# Patient Record
Sex: Male | Born: 1960 | Race: White | Hispanic: No | Marital: Single | Smoking: Never smoker
Health system: Southern US, Community
[De-identification: ages and names within clinical notes are randomized; demographics above are authoritative.]

## PROBLEM LIST (undated history)

## (undated) DIAGNOSIS — I4891 Unspecified atrial fibrillation: Secondary | ICD-10-CM

## (undated) DIAGNOSIS — M1990 Unspecified osteoarthritis, unspecified site: Secondary | ICD-10-CM

## (undated) DIAGNOSIS — I872 Venous insufficiency (chronic) (peripheral): Secondary | ICD-10-CM

## (undated) DIAGNOSIS — N184 Chronic kidney disease, stage 4 (severe): Secondary | ICD-10-CM

## (undated) DIAGNOSIS — N400 Enlarged prostate without lower urinary tract symptoms: Secondary | ICD-10-CM

## (undated) DIAGNOSIS — G4733 Obstructive sleep apnea (adult) (pediatric): Secondary | ICD-10-CM

## (undated) DIAGNOSIS — D510 Vitamin B12 deficiency anemia due to intrinsic factor deficiency: Secondary | ICD-10-CM

## (undated) HISTORY — DX: Vitamin B12 deficiency anemia due to intrinsic factor deficiency: D51.0

## (undated) HISTORY — DX: Unspecified atrial fibrillation: I48.91

## (undated) HISTORY — DX: Unspecified osteoarthritis, unspecified site: M19.90

## (undated) HISTORY — DX: Chronic kidney disease, stage 4 (severe): N18.4

## (undated) HISTORY — DX: Obstructive sleep apnea (adult) (pediatric): G47.33

---

## 2006-04-29 ENCOUNTER — Encounter: Admission: RE | Admit: 2006-04-29 | Discharge: 2006-04-29 | Payer: Self-pay | Admitting: Family Medicine

## 2009-01-05 ENCOUNTER — Ambulatory Visit: Payer: Self-pay | Admitting: Urology

## 2014-04-10 ENCOUNTER — Ambulatory Visit: Payer: Self-pay | Admitting: Specialist

## 2019-06-05 ENCOUNTER — Other Ambulatory Visit: Payer: Self-pay

## 2019-06-05 ENCOUNTER — Encounter: Payer: Self-pay | Admitting: Emergency Medicine

## 2019-06-05 ENCOUNTER — Inpatient Hospital Stay
Admission: EM | Admit: 2019-06-05 | Discharge: 2019-06-10 | DRG: 682 | Disposition: A | Discharge status: Home / Self Care | Payer: BC Managed Care – PPO | Attending: Internal Medicine | Admitting: Internal Medicine

## 2019-06-05 ENCOUNTER — Emergency Department: Payer: BC Managed Care – PPO

## 2019-06-05 DIAGNOSIS — N289 Disorder of kidney and ureter, unspecified: Secondary | ICD-10-CM

## 2019-06-05 DIAGNOSIS — E860 Dehydration: Secondary | ICD-10-CM | POA: Diagnosis present

## 2019-06-05 DIAGNOSIS — Z8249 Family history of ischemic heart disease and other diseases of the circulatory system: Secondary | ICD-10-CM

## 2019-06-05 DIAGNOSIS — I872 Venous insufficiency (chronic) (peripheral): Secondary | ICD-10-CM | POA: Diagnosis present

## 2019-06-05 DIAGNOSIS — N390 Urinary tract infection, site not specified: Secondary | ICD-10-CM | POA: Diagnosis present

## 2019-06-05 DIAGNOSIS — J189 Pneumonia, unspecified organism: Secondary | ICD-10-CM | POA: Diagnosis present

## 2019-06-05 DIAGNOSIS — N400 Enlarged prostate without lower urinary tract symptoms: Secondary | ICD-10-CM | POA: Diagnosis present

## 2019-06-05 DIAGNOSIS — D696 Thrombocytopenia, unspecified: Secondary | ICD-10-CM | POA: Diagnosis present

## 2019-06-05 DIAGNOSIS — R0600 Dyspnea, unspecified: Secondary | ICD-10-CM

## 2019-06-05 DIAGNOSIS — R509 Fever, unspecified: Secondary | ICD-10-CM

## 2019-06-05 DIAGNOSIS — Z20828 Contact with and (suspected) exposure to other viral communicable diseases: Secondary | ICD-10-CM | POA: Diagnosis present

## 2019-06-05 DIAGNOSIS — N179 Acute kidney failure, unspecified: Secondary | ICD-10-CM | POA: Diagnosis not present

## 2019-06-05 DIAGNOSIS — I4891 Unspecified atrial fibrillation: Secondary | ICD-10-CM | POA: Diagnosis present

## 2019-06-05 DIAGNOSIS — G4730 Sleep apnea, unspecified: Secondary | ICD-10-CM | POA: Diagnosis present

## 2019-06-05 DIAGNOSIS — Z6841 Body Mass Index (BMI) 40.0 and over, adult: Secondary | ICD-10-CM

## 2019-06-05 HISTORY — DX: Benign prostatic hyperplasia without lower urinary tract symptoms: N40.0

## 2019-06-05 HISTORY — DX: Venous insufficiency (chronic) (peripheral): I87.2

## 2019-06-05 LAB — URINALYSIS, ROUTINE W REFLEX MICROSCOPIC
Bilirubin Urine: NEGATIVE
Glucose, UA: NEGATIVE mg/dL
Ketones, ur: NEGATIVE mg/dL
Nitrite: NEGATIVE
Protein, ur: 100 mg/dL — AB
Specific Gravity, Urine: 1.016 (ref 1.005–1.030)
Squamous Epithelial / HPF: NONE SEEN (ref 0–5)
WBC, UA: >50 WBC/hpf — ABNORMAL HIGH (ref 0–5)
pH: 5 (ref 5.0–8.0)

## 2019-06-05 LAB — LACTIC ACID, PLASMA: Lactic Acid, Venous: 1.8 mmol/L (ref 0.5–1.9)

## 2019-06-05 LAB — CBC WITH DIFFERENTIAL/PLATELET
Abs Immature Granulocytes: 0.29 10*3/uL — ABNORMAL HIGH (ref 0.00–0.07)
Basophils Absolute: 0 10*3/uL (ref 0.0–0.1)
Basophils Relative: 0 %
Eosinophils Absolute: 0 10*3/uL (ref 0.0–0.5)
Eosinophils Relative: 0 %
HCT: 44.8 % (ref 39.0–52.0)
Hemoglobin: 15.5 g/dL (ref 13.0–17.0)
Immature Granulocytes: 2 %
Lymphocytes Relative: 4 %
Lymphs Abs: 0.5 10*3/uL — ABNORMAL LOW (ref 0.7–4.0)
MCH: 31.4 pg (ref 26.0–34.0)
MCHC: 34.6 g/dL (ref 30.0–36.0)
MCV: 90.7 fL (ref 80.0–100.0)
Monocytes Absolute: 0.7 10*3/uL (ref 0.1–1.0)
Monocytes Relative: 6 %
Neutro Abs: 10.6 10*3/uL — ABNORMAL HIGH (ref 1.7–7.7)
Neutrophils Relative %: 88 %
Platelets: 116 10*3/uL — ABNORMAL LOW (ref 150–400)
RBC: 4.94 MIL/uL (ref 4.22–5.81)
RDW: 12.9 % (ref 11.5–15.5)
WBC: 12.1 10*3/uL — ABNORMAL HIGH (ref 4.0–10.5)
nRBC: 0 % (ref 0.0–0.2)

## 2019-06-05 LAB — BASIC METABOLIC PANEL
Anion gap: 10 (ref 5–15)
BUN: 32 mg/dL — ABNORMAL HIGH (ref 6–20)
CO2: 19 mmol/L — ABNORMAL LOW (ref 22–32)
Calcium: 8.8 mg/dL — ABNORMAL LOW (ref 8.9–10.3)
Chloride: 103 mmol/L (ref 98–111)
Creatinine, Ser: 2.45 mg/dL — ABNORMAL HIGH (ref 0.61–1.24)
GFR calc Af Amer: 33 mL/min — ABNORMAL LOW (ref >60)
GFR calc non Af Amer: 28 mL/min — ABNORMAL LOW (ref >60)
Glucose, Bld: 125 mg/dL — ABNORMAL HIGH (ref 70–99)
Potassium: 3.8 mmol/L (ref 3.5–5.1)
Sodium: 132 mmol/L — ABNORMAL LOW (ref 135–145)

## 2019-06-05 LAB — HEPATIC FUNCTION PANEL
ALT: 19 U/L (ref 0–44)
AST: 27 U/L (ref 15–41)
Albumin: 3.1 g/dL — ABNORMAL LOW (ref 3.5–5.0)
Alkaline Phosphatase: 79 U/L (ref 38–126)
Bilirubin, Direct: 0.8 mg/dL — ABNORMAL HIGH (ref 0.0–0.2)
Indirect Bilirubin: 1.5 mg/dL — ABNORMAL HIGH (ref 0.3–0.9)
Total Bilirubin: 2.3 mg/dL — ABNORMAL HIGH (ref 0.3–1.2)
Total Protein: 6.9 g/dL (ref 6.5–8.1)

## 2019-06-05 LAB — TROPONIN I (HIGH SENSITIVITY)
Troponin I (High Sensitivity): 10 ng/L (ref <18)
Troponin I (High Sensitivity): 14 ng/L (ref <18)

## 2019-06-05 MED ORDER — SODIUM CHLORIDE 0.9 % IV SOLN
500.0000 mg | INTRAVENOUS | Status: DC
  Administered 2019-06-06 – 2019-06-10 (×5): 500 mg via INTRAVENOUS
  Filled 2019-06-05 (×9): qty 500

## 2019-06-05 MED ORDER — SODIUM CHLORIDE 0.9 % IV BOLUS
1000.0000 mL | Freq: Once | INTRAVENOUS | Status: AC
  Administered 2019-06-06: 1000 mL via INTRAVENOUS

## 2019-06-05 MED ORDER — HEPARIN SODIUM (PORCINE) 5000 UNIT/ML IJ SOLN
5000.0000 [IU] | Freq: Three times a day (TID) | INTRAMUSCULAR | Status: DC
  Administered 2019-06-06: 5000 [IU] via SUBCUTANEOUS
  Filled 2019-06-05 (×2): qty 1

## 2019-06-05 MED ORDER — ACETAMINOPHEN 325 MG PO TABS
650.0000 mg | ORAL_TABLET | Freq: Four times a day (QID) | ORAL | Status: DC | PRN
  Filled 2019-06-05: qty 2

## 2019-06-05 MED ORDER — SODIUM CHLORIDE 0.9 % IV SOLN
INTRAVENOUS | Status: AC
  Administered 2019-06-06: 05:00:00 via INTRAVENOUS

## 2019-06-05 MED ORDER — SODIUM CHLORIDE 0.9 % IV BOLUS
500.0000 mL | Freq: Once | INTRAVENOUS | Status: AC
  Administered 2019-06-05: 500 mL via INTRAVENOUS

## 2019-06-05 MED ORDER — SODIUM CHLORIDE 0.9 % IV SOLN
2.0000 g | INTRAVENOUS | Status: DC
  Administered 2019-06-05 – 2019-06-09 (×5): 2 g via INTRAVENOUS
  Filled 2019-06-05 (×2): qty 2
  Filled 2019-06-05: qty 20
  Filled 2019-06-05 (×2): qty 2
  Filled 2019-06-05: qty 20

## 2019-06-05 NOTE — ED Provider Notes (Signed)
[facility] Emergency Department Provider Note ____________________________________________   First MD Initiated Contact with Patient 06/05/19 2003     (approximate)  I have reviewed the triage vital signs and the nursing notes.   HISTORY  Chief Complaint Fever    HPI Otelia SanteeDarrell W Dias is a 58 y.o. male who presents with fever over the last several days, intermittent course, measured to 101-102, and temporarily relieved by Tylenol.  The patient also reports shortness of breath for about 1 to 2 weeks.  He denies cough.  He has no vomiting or diarrhea.  He reports some generalized weakness, and he reports foul-smelling urine for several weeks, but denies other focal symptoms.  He has no known exposures to COVID-19.  History reviewed. No pertinent past medical history.  There are no active problems to display for this patient.   History reviewed. No pertinent surgical history.  Prior to Admission medications   Not on File    Allergies Patient has no known allergies.  No family history on file.  Social History Social History   Tobacco Use  . Smoking status: Never Smoker  . Smokeless tobacco: Never Used  Substance Use Topics  . Alcohol use: Never    Frequency: Never  . Drug use: Never    Review of Systems  Constitutional: Positive for fever. Eyes: No redness ENT: No sore throat. Cardiovascular: Denies chest pain. Respiratory: Positive for shortness of breath. Gastrointestinal: No vomiting or diarrhea.  Genitourinary: Negative for dysuria.  Musculoskeletal: Negative for back pain. Skin: Negative for rash. Neurological: Negative for headache.   ____________________________________________   PHYSICAL EXAM:  VITAL SIGNS: ED Triage Vitals  Enc Vitals Group     BP 06/05/19 1428 128/85     Pulse Rate 06/05/19 1428 (!) 117     Resp 06/05/19 1428 (!) 26     Temp 06/05/19 1428 99.6 F (37.6 C)     Temp Source 06/05/19 1428 Oral    SpO2 06/05/19 1428 95 %     Weight 06/05/19 1429 (!) 427 lb 11.1 oz (194 kg)     Height 06/05/19 1429 6' (1.829 m)     Head Circumference --      Peak Flow --      Pain Score 06/05/19 1429 0     Pain Loc --      Pain Edu? --      Excl. in GC? --     Constitutional: Alert and oriented.  Relatively well appearing and in no acute distress. Eyes: Conjunctivae are normal.  Head: Atraumatic. Nose: No congestion/rhinnorhea. Mouth/Throat: Mucous membranes are slightly dry.   Neck: Normal range of motion.  Cardiovascular: Tachycardic, regular rhythm. Grossly normal heart sounds.  Good peripheral circulation. Respiratory: Slightly increased respiratory effort.  No retractions. Lungs CTAB. Gastrointestinal: Soft and nontender. No distention.  Genitourinary: No flank tenderness. Musculoskeletal: Chronic appearing venous stasis changes bilaterally.  No erythema, induration, or abnormal warmth. Neurologic:  Normal speech and language. No gross focal neurologic deficits are appreciated.  Skin:  Skin is warm and dry. No rash noted. Psychiatric: Mood and affect are normal. Speech and behavior are normal.  ____________________________________________   LABS (all labs ordered are listed, but only abnormal results are displayed)  Labs Reviewed  CBC WITH DIFFERENTIAL/PLATELET - Abnormal; Notable for the following components:      Result Value   WBC 12.1 (*)    Platelets 116 (*)    Neutro Abs 10.6 (*)    Lymphs Abs 0.5 (*)  Abs Immature Granulocytes 0.29 (*)    All other components within normal limits  BASIC METABOLIC PANEL - Abnormal; Notable for the following components:   Sodium 132 (*)    CO2 19 (*)    Glucose, Bld 125 (*)    BUN 32 (*)    Creatinine, Ser 2.45 (*)    Calcium 8.8 (*)    GFR calc non Af Amer 28 (*)    GFR calc Af Amer 33 (*)    All other components within normal limits  HEPATIC FUNCTION PANEL - Abnormal; Notable for the following components:   Albumin 3.1 (*)     Total Bilirubin 2.3 (*)    Bilirubin, Direct 0.8 (*)    Indirect Bilirubin 1.5 (*)    All other components within normal limits  SARS CORONAVIRUS 2 (TAT 6-24 HRS)  LACTIC ACID, PLASMA  URINALYSIS, COMPLETE (UACMP) WITH MICROSCOPIC  TROPONIN I (HIGH SENSITIVITY)  TROPONIN I (HIGH SENSITIVITY)  TROPONIN I (HIGH SENSITIVITY)   ____________________________________________  EKG  ED ECG REPORT I, Arta Silence, the attending physician, personally viewed and interpreted this ECG.  Date: 06/05/2019 EKG Time: 1441 Rate: 119 Rhythm: Sinus tachycardia QRS Axis: Left axis Intervals: normal ST/T Wave abnormalities: Nonspecific T wave abnormalities Narrative Interpretation: Nonspecific abnormalities with no evidence of acute ischemia  ____________________________________________  RADIOLOGY  CXR: Right lower lobe infiltrate versus atelectasis  ____________________________________________   PROCEDURES  Procedure(s) performed: No  Procedures  Critical Care performed: No ____________________________________________   INITIAL IMPRESSION / ASSESSMENT AND PLAN / ED COURSE  Pertinent labs & imaging results that were available during my care of the patient were reviewed by me and considered in my medical decision making (see chart for details).  58 year old male with no significant PMH presents with fever over the last several days associated with shortness of breath over the last 1 to 2 weeks.  He also reports foul-smelling urine although this is a more chronic symptom.  He denies other acute symptoms.  I reviewed the past medical records in [place], but the patient has no prior ED visits or admissions here and I do not see any relevant outpatient records other than medication refills.  On exam, the patient is overall relatively comfortable appearing.  He is tachycardic and has slightly increased work of breathing but no acute respiratory distress.  His other  vital signs are normal.  The remainder of the exam is as described above.  Initial work-up reveals right lower lobe atelectasis versus infiltrate.  Given the fever and shortness of breath, I suspect that this represents pneumonia.  Differential also includes urinary tract infection, COVID-19, or other viral etiology.  Lab work-up also reveals apparent AKI although I do not have an old creatinine to compare to.  The patient has no known history of renal failure.  Given this finding, I will plan to admit the patient.  ----------------------------------------- 9:03 PM on 06/05/2019 -----------------------------------------  I discussed the patient with the hospitalist for admission.  IV antibiotics have been ordered to cover for CAP.  The lactate is normal, but I have ordered fluids given the AKI and likely dehydration.  ____________________________________________   FINAL CLINICAL IMPRESSION(S) / ED DIAGNOSES  Final diagnoses:  Community acquired pneumonia of right lower lobe of lung  Acute renal insufficiency      NEW MEDICATIONS STARTED DURING THIS VISIT:  New Prescriptions   No medications on file     Note:  This document was prepared using Dragon voice recognition software  and may include unintentional dictation errors.    Dionne Bucy, MD 06/05/19 2104

## 2019-06-05 NOTE — ED Notes (Signed)
Attempted x 2 to stick patient for lactic.  Unsuccessful.  Phlebotomy called to draw patient.

## 2019-06-05 NOTE — ED Triage Notes (Signed)
Pt in via POV, sent over from [facility], complaints of fever x 3 days.  Pt tachypneic and short of breath on exertion.  NAD noted at this time.

## 2019-06-05 NOTE — H&P (Signed)
History and Physical    Louis [name] JAS:[id number] DOB: 11/08/1960 DOA: 06/05/2019  PCP: Patient, No Pcp Per  Patient coming from: Home  I have personally briefly reviewed patient's old medical records in [facility]  Chief Complaint: Fevers  HPI: Louis [name] is a 58 y.o. 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  Patient states he has been having 4 days of fevers and diaphoresis associated with nausea without emesis.  He denies any shortness of breath or cough.  He denies any chest pain, abdominal pain, diarrhea.  He reports decreased urine output than expected.  He does say he has a history of enlarged prostate for which he took finasteride in the past however has not been taking it for some time.    He has been taking Tylenol every 4 hours without relief.  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  He denies any history of tobacco use, alcohol use, or illicit drug use.  ED Course:  Initial vitals showed BP 128/85, pulse 117, RR 26, temp 99.6 Fahrenheit, SPO2 95% on room air.  Labs are notable for WBC 12.1, hemoglobin 15.5, platelets 116,000, sodium 132, potassium 3.8, bicarb 19, BUN 32, creatinine 2.45, serum glucose 125, troponin I 10 >> 14, lactic acid 1.8.  SARS-CoV-2 test was obtained and pending.  2 view chest x-ray showed minimal bibasilar densities, suspected right lung base atelectasis.  Patient was given 500 mLs normal saline and started on IV ceftriaxone and azithromycin.  Hospitalist service was consulted to admit for further evaluation and management  Review of Systems: All systems reviewed and are negative except as documented in history of present illness above.   Past Medical History:  Diagnosis Date  . BPH (benign prostatic hyperplasia)   . Chronic venous stasis dermatitis     History reviewed. No pertinent surgical  history.  Social History:  reports that he has never smoked. He has never used smokeless tobacco. He reports that he does not drink alcohol or use drugs.  No Known Allergies  Family History  Problem Relation Age of Onset  . Heart disease Father      Prior to Admission medications   Not on File    Physical Exam: Vitals:   06/05/19 1428 06/05/19 1429  BP: 128/85   Pulse: (!) 117   Resp: (!) 26   Temp: 99.6 F (37.6 C)   TempSrc: Oral   SpO2: 95%   Weight:  (!) 194 kg  Height:  6' (1.829 m)    Constitutional: Obese man resting in bed with head elevated, NAD, calm, comfortable Eyes: PERRL, lids and conjunctivae normal ENMT: Mucous membranes are moist. Posterior pharynx clear of any exudate or lesions.Normal dentition.  Neck: normal, supple, no masses. Respiratory: clear to auscultation bilaterally, no wheezing, no crackles. Normal respiratory effort. No accessory muscle use.  Cardiovascular: Tachycardic, no murmurs / rubs / gallops. No extremity edema. 2+ pedal pulses. Abdomen: no tenderness, no masses palpated. No hepatosplenomegaly. Bowel sounds positive.  Musculoskeletal: no clubbing / cyanosis. No joint deformity upper and lower extremities. Good ROM, no contractures. Normal muscle tone.  Skin: Diaphoretic.  Stasis dermatitis changes of both legs.  No induration Neurologic: CN 2-12 grossly intact. Sensation intact, DTR normal. Strength 5/5 in all 4.  Psychiatric: Normal judgment and insight. Alert and oriented x 3. Normal mood.    Labs on Admission: I have  personally reviewed following labs and imaging studies  CBC: Recent Labs  Lab 06/05/19 1433  WBC 12.1*  NEUTROABS 10.6*  HGB 15.5  HCT 44.8  MCV 90.7  PLT 034*   Basic Metabolic Panel: Recent Labs  Lab 06/05/19 1433  NA 132*  K 3.8  CL 103  CO2 19*  GLUCOSE 125*  BUN 32*  CREATININE 2.45*  CALCIUM 8.8*   GFR: Estimated Creatinine Clearance: 58.4 mL/min (A) (by C-G formula based on SCr of 2.45  mg/dL (H)). Liver Function Tests: Recent Labs  Lab 06/05/19 1433  AST 27  ALT 19  ALKPHOS 79  BILITOT 2.3*  PROT 6.9  ALBUMIN 3.1*   No results for input(s): LIPASE, AMYLASE in the last 168 hours. No results for input(s): AMMONIA in the last 168 hours. Coagulation Profile: No results for input(s): INR, PROTIME in the last 168 hours. Cardiac Enzymes: No results for input(s): CKTOTAL, CKMB, CKMBINDEX, TROPONINI in the last 168 hours. BNP (last 3 results) No results for input(s): PROBNP in the last 8760 hours. HbA1C: No results for input(s): HGBA1C in the last 72 hours. CBG: No results for input(s): GLUCAP in the last 168 hours. Lipid Profile: No results for input(s): CHOL, HDL, LDLCALC, TRIG, CHOLHDL, LDLDIRECT in the last 72 hours. Thyroid Function Tests: No results for input(s): TSH, T4TOTAL, FREET4, T3FREE, THYROIDAB in the last 72 hours. Anemia Panel: No results for input(s): VITAMINB12, FOLATE, FERRITIN, TIBC, IRON, RETICCTPCT in the last 72 hours. Urine analysis: No results found for: COLORURINE, APPEARANCEUR, LABSPEC, PHURINE, GLUCOSEU, HGBUR, BILIRUBINUR, KETONESUR, PROTEINUR, UROBILINOGEN, NITRITE, LEUKOCYTESUR  Radiological Exams on Admission: Dg Chest 2 View  Result Date: 06/05/2019 CLINICAL DATA:  58 year old male with shortness of breath and fever. EXAM: CHEST - 2 VIEW COMPARISON:  Chest radiograph dated 04/10/2014. FINDINGS: Mild eventration of the right hemidiaphragm similar to prior radiograph. Minimal bibasilar densities, likely atelectatic changes. No focal consolidation, pleural effusion, or pneumothorax. The cardiac silhouette is within normal limits. No acute osseous pathology. IMPRESSION: Right lung base atelectasis.  Infiltrate is less likely. Electronically Signed   By: Anner Crete M.D.   On: 06/05/2019 15:01    EKG: Independently reviewed. Sinus tachycardia, PVC, LAD, no prior for comparison.  Assessment/Plan Principal Problem:   Febrile illness  Active Problems:   AKI (acute kidney injury) ([place])   Thrombocytopenia (HCC)  Louis [name] is a 58 y.o. 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  Febrile illness: Patient with reported fevers at home with diaphoresis and flulike symptoms.  Leukocytosis present on admission.  Chest x-ray without obvious focal consolidation.  He was started on empiric IV ceftriaxone/azithromycin in ED for possible CAP. SARS-CoV-2 test is pending. -Continue IV ceftriaxone/azithromycin for now, de-escalate as able -Check procalcitonin, obtain blood cultures, check urinalysis -Follow-up SARS-CoV-2 test, add on influenza test -Continue as needed Tylenol  Acute kidney injury: Creatinine 2.45 on admission, no baseline for comparison.  Suspect AKI in setting of dehydration due to above. -Give additional 1 L normal saline followed by maintenance IVFs overnight -Check renal ultrasound, urinalysis  Thrombocytopenia: Mild, without obvious bleeding.  No indication for transfusion at this time.  Continue to monitor.  DVT prophylaxis: Subcutaneous heparin Code Status: Full code, confirmed with patient Family Communication: Discussed with patient, he has discussed with family Disposition Plan: Pending clinical progress Consults called: None Admission status: Observation   Zada Finders MD [facility]  If 7PM-7AM,  please contact night-coverage [email or web address]  06/05/2019, 11:32 PM

## 2019-06-05 NOTE — ED Triage Notes (Signed)
First Nurse Note:  Arrives from [facility] for ED evaluation of 4 day history of fever, sob.

## 2019-06-06 ENCOUNTER — Observation Stay
Admit: 2019-06-06 | Discharge: 2019-06-06 | Disposition: A | Discharge status: Home / Self Care | Payer: BC Managed Care – PPO | Attending: Nurse Practitioner | Admitting: Nurse Practitioner

## 2019-06-06 ENCOUNTER — Other Ambulatory Visit: Payer: Self-pay

## 2019-06-06 ENCOUNTER — Observation Stay: Payer: BC Managed Care – PPO

## 2019-06-06 DIAGNOSIS — N289 Disorder of kidney and ureter, unspecified: Secondary | ICD-10-CM | POA: Diagnosis present

## 2019-06-06 DIAGNOSIS — N390 Urinary tract infection, site not specified: Secondary | ICD-10-CM | POA: Diagnosis present

## 2019-06-06 DIAGNOSIS — J189 Pneumonia, unspecified organism: Secondary | ICD-10-CM | POA: Diagnosis present

## 2019-06-06 DIAGNOSIS — N179 Acute kidney failure, unspecified: Secondary | ICD-10-CM | POA: Diagnosis present

## 2019-06-06 DIAGNOSIS — R509 Fever, unspecified: Secondary | ICD-10-CM | POA: Diagnosis not present

## 2019-06-06 DIAGNOSIS — E860 Dehydration: Secondary | ICD-10-CM | POA: Diagnosis present

## 2019-06-06 DIAGNOSIS — N400 Enlarged prostate without lower urinary tract symptoms: Secondary | ICD-10-CM | POA: Diagnosis present

## 2019-06-06 DIAGNOSIS — Z8249 Family history of ischemic heart disease and other diseases of the circulatory system: Secondary | ICD-10-CM | POA: Diagnosis not present

## 2019-06-06 DIAGNOSIS — D696 Thrombocytopenia, unspecified: Secondary | ICD-10-CM | POA: Diagnosis present

## 2019-06-06 DIAGNOSIS — G4730 Sleep apnea, unspecified: Secondary | ICD-10-CM | POA: Diagnosis present

## 2019-06-06 DIAGNOSIS — I4891 Unspecified atrial fibrillation: Secondary | ICD-10-CM | POA: Diagnosis not present

## 2019-06-06 DIAGNOSIS — N3000 Acute cystitis without hematuria: Secondary | ICD-10-CM | POA: Diagnosis not present

## 2019-06-06 DIAGNOSIS — Z20828 Contact with and (suspected) exposure to other viral communicable diseases: Secondary | ICD-10-CM | POA: Diagnosis present

## 2019-06-06 DIAGNOSIS — I872 Venous insufficiency (chronic) (peripheral): Secondary | ICD-10-CM | POA: Diagnosis present

## 2019-06-06 DIAGNOSIS — Z6841 Body Mass Index (BMI) 40.0 and over, adult: Secondary | ICD-10-CM | POA: Diagnosis not present

## 2019-06-06 LAB — BASIC METABOLIC PANEL
Anion gap: 11 (ref 5–15)
BUN: 37 mg/dL — ABNORMAL HIGH (ref 6–20)
CO2: 18 mmol/L — ABNORMAL LOW (ref 22–32)
Calcium: 8.1 mg/dL — ABNORMAL LOW (ref 8.9–10.3)
Chloride: 104 mmol/L (ref 98–111)
Creatinine, Ser: 2.72 mg/dL — ABNORMAL HIGH (ref 0.61–1.24)
GFR calc Af Amer: 29 mL/min — ABNORMAL LOW (ref >60)
GFR calc non Af Amer: 25 mL/min — ABNORMAL LOW (ref >60)
Glucose, Bld: 118 mg/dL — ABNORMAL HIGH (ref 70–99)
Potassium: 4 mmol/L (ref 3.5–5.1)
Sodium: 133 mmol/L — ABNORMAL LOW (ref 135–145)

## 2019-06-06 LAB — CBC WITH DIFFERENTIAL/PLATELET
Abs Immature Granulocytes: 0.07 10*3/uL (ref 0.00–0.07)
Basophils Absolute: 0.1 10*3/uL (ref 0.0–0.1)
Basophils Relative: 1 %
Eosinophils Absolute: 0 10*3/uL (ref 0.0–0.5)
Eosinophils Relative: 0 %
HCT: 39.9 % (ref 39.0–52.0)
Hemoglobin: 14 g/dL (ref 13.0–17.0)
Immature Granulocytes: 1 %
Lymphocytes Relative: 9 %
Lymphs Abs: 0.9 10*3/uL (ref 0.7–4.0)
MCH: 31.3 pg (ref 26.0–34.0)
MCHC: 35.1 g/dL (ref 30.0–36.0)
MCV: 89.3 fL (ref 80.0–100.0)
Monocytes Absolute: 0.8 10*3/uL (ref 0.1–1.0)
Monocytes Relative: 8 %
Neutro Abs: 7.8 10*3/uL — ABNORMAL HIGH (ref 1.7–7.7)
Neutrophils Relative %: 81 %
Platelets: 106 10*3/uL — ABNORMAL LOW (ref 150–400)
RBC: 4.47 MIL/uL (ref 4.22–5.81)
RDW: 13.1 % (ref 11.5–15.5)
Smear Review: NORMAL
WBC: 9.6 10*3/uL (ref 4.0–10.5)
nRBC: 0 % (ref 0.0–0.2)

## 2019-06-06 LAB — SODIUM, URINE, RANDOM: Sodium, Ur: 26 mmol/L

## 2019-06-06 LAB — TROPONIN I (HIGH SENSITIVITY)
Troponin I (High Sensitivity): 15 ng/L (ref <18)
Troponin I (High Sensitivity): 13 ng/L (ref <18)

## 2019-06-06 LAB — MAGNESIUM: Magnesium: 2.3 mg/dL (ref 1.7–2.4)

## 2019-06-06 LAB — PROTIME-INR
INR: 1.1 (ref 0.8–1.2)
Prothrombin Time: 14.5 seconds (ref 11.4–15.2)

## 2019-06-06 LAB — ECHOCARDIOGRAM COMPLETE
Height: 72 in
Weight: 6843.08 oz

## 2019-06-06 LAB — STREP PNEUMONIAE URINARY ANTIGEN: Strep Pneumo Urinary Antigen: NEGATIVE

## 2019-06-06 LAB — PROCALCITONIN: Procalcitonin: 3.99 ng/mL

## 2019-06-06 LAB — TSH: TSH: 3.654 u[IU]/mL (ref 0.350–4.500)

## 2019-06-06 LAB — CREATININE, URINE, RANDOM: Creatinine, Urine: 208 mg/dL

## 2019-06-06 LAB — INFLUENZA PANEL BY PCR (TYPE A & B)
Influenza A By PCR: NEGATIVE
Influenza B By PCR: NEGATIVE

## 2019-06-06 LAB — SARS CORONAVIRUS 2 (TAT 6-24 HRS): SARS Coronavirus 2: NEGATIVE

## 2019-06-06 LAB — APTT: aPTT: 31 seconds (ref 24–36)

## 2019-06-06 LAB — HIV ANTIBODY (ROUTINE TESTING W REFLEX): HIV Screen 4th Generation wRfx: NONREACTIVE

## 2019-06-06 LAB — HEPARIN LEVEL (UNFRACTIONATED): Heparin Unfractionated: 0.57 IU/mL (ref 0.30–0.70)

## 2019-06-06 MED ORDER — HEPARIN BOLUS VIA INFUSION
4000.0000 [IU] | Freq: Once | INTRAVENOUS | Status: AC
  Administered 2019-06-06: 4000 [IU] via INTRAVENOUS
  Filled 2019-06-06: qty 4000

## 2019-06-06 MED ORDER — DILTIAZEM HCL-DEXTROSE 100-5 MG/100ML-% IV SOLN (PREMIX)
5.0000 mg/h | INTRAVENOUS | Status: DC
  Filled 2019-06-06 (×2): qty 100

## 2019-06-06 MED ORDER — DILTIAZEM HCL 25 MG/5ML IV SOLN
10.0000 mg | Freq: Once | INTRAVENOUS | Status: AC
  Administered 2019-06-06: 10 mg via INTRAVENOUS
  Filled 2019-06-06: qty 5

## 2019-06-06 MED ORDER — DILTIAZEM LOAD VIA INFUSION
10.0000 mg | Freq: Once | INTRAVENOUS | Status: AC
  Administered 2019-06-06: 10 mg via INTRAVENOUS
  Filled 2019-06-06: qty 10

## 2019-06-06 MED ORDER — DILTIAZEM HCL 25 MG/5ML IV SOLN
INTRAVENOUS | Status: AC
  Filled 2019-06-06: qty 5

## 2019-06-06 MED ORDER — DILTIAZEM HCL 25 MG/5ML IV SOLN
INTRAVENOUS | Status: AC
  Administered 2019-06-06: 5 mg via INTRAVENOUS
  Filled 2019-06-06: qty 5

## 2019-06-06 MED ORDER — DILTIAZEM HCL 100 MG IV SOLR
5.0000 mg/h | INTRAVENOUS | Status: DC
  Administered 2019-06-06: 5 mg/h via INTRAVENOUS
  Administered 2019-06-06 – 2019-06-07 (×4): 15 mg/h via INTRAVENOUS
  Filled 2019-06-06 (×5): qty 100

## 2019-06-06 MED ORDER — DILTIAZEM HCL 100 MG IV SOLR
INTRAVENOUS | Status: AC
  Filled 2019-06-06: qty 100

## 2019-06-06 MED ORDER — METOPROLOL TARTRATE 25 MG PO TABS
25.0000 mg | ORAL_TABLET | Freq: Two times a day (BID) | ORAL | Status: DC
  Administered 2019-06-06 – 2019-06-10 (×8): 25 mg via ORAL
  Filled 2019-06-06 (×9): qty 1

## 2019-06-06 MED ORDER — DILTIAZEM HCL 25 MG/5ML IV SOLN
5.0000 mg | Freq: Once | INTRAVENOUS | Status: AC
  Administered 2019-06-06: 06:00:00 5 mg via INTRAVENOUS

## 2019-06-06 MED ORDER — DILTIAZEM HCL-DEXTROSE 125-5 MG/125ML-% IV SOLN (PREMIX)
5.0000 mg/h | INTRAVENOUS | Status: DC
  Filled 2019-06-06: qty 125

## 2019-06-06 MED ORDER — HEPARIN (PORCINE) 25000 UT/250ML-% IV SOLN
2500.0000 [IU]/h | INTRAVENOUS | Status: DC
  Administered 2019-06-06 – 2019-06-07 (×2): 2000 [IU]/h via INTRAVENOUS
  Filled 2019-06-06 (×6): qty 250

## 2019-06-06 NOTE — Consult Note (Signed)
Cardiology Consultation Note    Patient ID: Louis [name], MRN: [id number], DOB/AGE: [id number] y.o. Admit date: 06/05/2019   Date of Consult: 06/06/2019 Primary Physician: Patient, No Pcp Per Primary Cardiologist: none  Chief Complaint: fever/sob Reason for Consultation: afib with rvr Requesting MD: Dr. Lurene ShadowSpongberg  HPI: Louis [name] is a 58 y.o. male with history of 3 to 4 days of fever which was reported to be in the 101-102 range, history of shortness of breath and generalized weakness.  He reported foul-smelling urine for several days.  He also complained nausea without vomiting.  He has chronic lower extremity edema.  He has a history of benign prostatic hypertrophy but no prior cardiac history per report.  Troponin x2 normal. Urine abnormal. He was noted to be tachycardic. CXR showed right lung base atelectasis. Renal ultrasound normal. Creatinine 2.45, Tot bili 2.3. He was given iv diltiazem and iv fluids with emperic abx.  His rate responded slightly to 10 mg bolus and he was placed on 5 mg/h of Cardizem drip.  His rate remains in the 150-170 range.  He complains of some shortness of breath.  He has been nauseated but been able to eat recently.  He denies vomiting.  He denies syncope or presyncope.  He denies chest pain.  He denies any prior cardiac history.  He is on no medications and is not does not regularly see a physician.  Currently patient is able to lay on his left side flat in the ER.  Heart rate is in the 150-170 range.  His vital signs show a blood pressure systolic in the 120 range.  He takes a lot of NSAIDs for musculoskeletal pain.  He recently has been taking Tylenol for his fever.  Past Medical History:  Diagnosis Date  . BPH (benign prostatic hyperplasia)   . Chronic venous stasis dermatitis       Surgical History: History reviewed. No pertinent surgical history.   Home Meds: Prior to Admission medications   Not on File    Inpatient Medications:   . heparin  5,000 Units Subcutaneous Q8H   . diltiazem (CARDIZEM) infusion    . sodium chloride 125 mL/hr at 06/06/19 0502  . azithromycin Stopped (06/06/19 0329)  . cefTRIAXone (ROCEPHIN)  IV Stopped (06/05/19 2204)  . diltiazem (CARDIZEM) infusion      Allergies: No Known Allergies  Social History   Socioeconomic History  . Marital status: Single    Spouse name: Not on file  . Number of children: Not on file  . Years of education: Not on file  . Highest education level: Not on file  Occupational History  . Not on file  Social Needs  . Financial resource strain: Not on file  . Food insecurity    Worry: Not on file    Inability: Not on file  . Transportation needs    Medical: Not on file    Non-medical: Not on file  Tobacco Use  . Smoking status: Never Smoker  . Smokeless tobacco: Never Used  Substance and Sexual Activity  . Alcohol use: Never    Frequency: Never  . Drug use: Never  . Sexual activity: Not on file  Lifestyle  . Physical activity    Days per week: Not on file    Minutes per session: Not on file  . Stress: Not on file  Relationships  . Social Musicianconnections    Talks on phone: Not on file    Gets together: Not  on file    Attends religious service: Not on file    Active member of club or organization: Not on file    Attends meetings of clubs or organizations: Not on file    Relationship status: Not on file  . Intimate partner violence    Fear of current or ex partner: Not on file    Emotionally abused: Not on file    Physically abused: Not on file    Forced sexual activity: Not on file  Other Topics Concern  . Not on file  Social History Narrative  . Not on file     Family History  Problem Relation Age of Onset  . Heart disease Father      Review of Systems: A 12-system review of systems was performed and is negative except as noted in the HPI.  Labs: No results for input(s): CKTOTAL, CKMB, TROPONINI in the last 72 hours. Lab Results   Component Value Date   WBC 12.1 (H) 06/05/2019   HGB 15.5 06/05/2019   HCT 44.8 06/05/2019   MCV 90.7 06/05/2019   PLT 116 (L) 06/05/2019    Recent Labs  Lab 06/05/19 1433  NA 132*  K 3.8  CL 103  CO2 19*  BUN 32*  CREATININE 2.45*  CALCIUM 8.8*  PROT 6.9  BILITOT 2.3*  ALKPHOS 79  ALT 19  AST 27  GLUCOSE 125*   No results found for: CHOL, HDL, LDLCALC, TRIG No results found for: DDIMER  Radiology/Studies:  Dg Chest 2 View  Result Date: 06/05/2019 CLINICAL DATA:  58 year old male with shortness of breath and fever. EXAM: CHEST - 2 VIEW COMPARISON:  Chest radiograph dated 04/10/2014. FINDINGS: Mild eventration of the right hemidiaphragm similar to prior radiograph. Minimal bibasilar densities, likely atelectatic changes. No focal consolidation, pleural effusion, or pneumothorax. The cardiac silhouette is within normal limits. No acute osseous pathology. IMPRESSION: Right lung base atelectasis.  Infiltrate is less likely. Electronically Signed   By: Anner Crete M.D.   On: 06/05/2019 15:01   US Renal  Result Date: 06/06/2019 CLINICAL DATA:  Acute renal injury EXAM: RENAL / URINARY TRACT ULTRASOUND COMPLETE COMPARISON:  None. FINDINGS: Right Kidney: Renal measurements: 13.9 x 7.9 x 6.6 cm. = volume: 377 mL . Echogenicity within normal limits. No mass or hydronephrosis visualized. Left Kidney: Renal measurements: 13.1 x 7.5 x 6.3 cm = volume: 320: ML. Echogenicity within normal limits. No mass or hydronephrosis visualized. Bladder: Appears normal for degree of bladder distention. Other: None. IMPRESSION: Unremarkable renal ultrasound. Electronically Signed   By: Inez Catalina M.D.   On: 06/06/2019 02:31    Wt Readings from Last 3 Encounters:  06/05/19 (!) 194 kg    EKG: Narrow complex tachycardia.  No obvious P waves seen.  Sinus tachycardia versus atrial fibrillation with rapid ventricular response.   Physical Exam: Morbidly obese Caucasian male in no acute  distress Blood pressure 121/66, pulse 80, temperature 98.2 F (36.8 C), temperature source Oral, resp. rate (!) 24, height 6' (1.829 m), weight (!) 194 kg, SpO2 95 %. Body mass index is 58.01 kg/m. General: Morbidly obese Caucasian male in no acute distress. Head: Normocephalic, atraumatic, sclera non-icteric, no xanthomas, nares are without discharge.  Neck: Negative for carotid bruits. JVD not elevated. Lungs: Clear bilaterally to auscultation without wheezes, rales, or rhonchi. Breathing is unlabored.  Heart: Tachycardic with no audible murmurs although body habitus limits auscultation Abdomen: Soft, non-tender, non-distended with normoactive bowel sounds.  Not able to determine hepatomegaly or  masses due to morbid obesity. Msk:  Strength and tone appear normal for age. Extremities: No clubbing or cyanosis.  3-4+ edema in lower extremities distal pedal pulses are not easily palpated due to lower extremity edema and obesity. Neuro: Alert and oriented X 3. No facial asymmetry. No focal deficit. Moves all extremities spontaneously. Psych:  Responds to questions appropriately with a normal affect.     Assessment and Plan  58 year old male with no prior cardiac history with reported history of BPH and chronic venous stasis dermatitis in his lower extremities who presented to the ER with several day history of fevers by report of 101 202.  He had some foul-smelling urine.  EKG showed a narrow complex tachycardia.  Difficult to assess whether or not sinus tachycardia versus atrial fibrillation rapid ventricular response.  Was given 10 mg bolus of IV diltiazem followed by being placed on 5 mg/h of drip.  Rate remains somewhat elevated.  Pressure appears to be soft but holding.  Likely has urinary tract infection.  Will need to evaluate bladder for residual.  Chest x-ray did not suggest pulmonary edema and clinically the patient does not have this at present  Atrial fibrillation/sinus  tachycardia-likely stimulated by febrile illness which appears to be urinary tract infection.  Agree with empiric antibiotics and IV fluids.  Urine culture pending.  Will attempt to control rate with diltiazem at present.  Agree with another 10 mg bolus followed by increasing the drip rate to 10 mg/h.  We will continue with IV Cardizem until rate control is achieved then will attempt to convert to p.o. Cardizem.  Will hold on anticoagulation fully at present until determination of rhythm more definitively is achieved.  Will repeat EKG as rate is improved somewhat to determine whether rhythm is A. fib or sinus tachycardia.  Sleep apnea is probably also present given the body habitus and may be playing a role in his dysrhythmia.  This will need to be assessed and treated going forward.  Will evaluate LV function with an echocardiogram.  This will be more accurate if his rate is controlled.  Patient is ruled out for myocardial infarction with negative troponin high-sensitivity x2.  Electrolytes appear normal with a magnesium of 2.3 potassium 3.8 TSH was 3.654.  Urinary tract infection-likely cause of fever.  Agree with current treatment.  Signed, Dalia Heading MD 06/06/2019, 7:38 AM Pager: [phone]

## 2019-06-06 NOTE — Progress Notes (Signed)
Subjective: Per nursing staff: Heart rate is ranging from 130-160's irregular. EKG shows atrial fib with rvr 168. Patient denies shortness of breath or chest pain. Blood pressure is 123/100  Objective: On arrival to the bedside, he was afebrile with blood pressure 121/66 mm Hg and pulse rate 178 beats/min. There were no focal neurological deficits; he was alert and oriented x4, and denies SOB, palpitations or chest pain.  Assessment: 58 y.o. 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  Plan: 1. New onset Atrial Fibrillation with RVR- No hx of afib.  - Low suspicion for PE however if clinically warranted or patient becomes symptomatic may consider CTA chest r/o PE - EKG shows afib with RVR rate of 168b/m - Check Troponins - TSH, FT4 - CXR shows right lung atelectasis - TTEcho - Will attempt rate control with IV Diltiazem if no improvement will start  5-15mg /hr drip then Diltiazem 30-90mg  PO qid while titrating dose, then transition to SA daily formulation (max 360mg /day) - ASA 81mg  PO daily - Cardiology Consultation placed   Rufina Falco, DNP, CCRN, FNP-C [facility] Nurse Practitioner Between 7pm to [place] - Pager [phone] Actively using [employer]iku secure chat messaging  After 7am go to [email or web address] - [email or web address]  [facility] [employer]  [phone]

## 2019-06-06 NOTE — ED Notes (Signed)
This RN bladder scanned patient:  145ml showing in bladder.

## 2019-06-06 NOTE — Progress Notes (Signed)
*  PRELIMINARY RESULTS* Echocardiogram 2D Echocardiogram has been performed.  Sherrie Sport 06/06/2019, 9:37 AM

## 2019-06-06 NOTE — Progress Notes (Signed)
Rate has improved slightly on Cardizem drip at 15 mg/h and after a 10 mg second bolus.  Able to discern A. fib as the underlying rhythm.  We will discontinue subcu heparin and placed on IV heparin for now until determination of long-term anticoagulation is ascertained.  Will place on metoprolol tartrate 25 mg p.o. twice daily for now and continue with Cardizem at current rate.  Will follow rate response, hemodynamics and symptoms.  Echo pending.  Patient appears fairly asymptomatic at present.  Is hemodynamically stable with a systolic blood pressure in the 140s.

## 2019-06-06 NOTE — Consult Note (Signed)
ANTICOAGULATION CONSULT NOTE - Initial Consult  Pharmacy Consult for Heparin Indication: atrial fibrillation  Allergies  Allergen Reactions  . Terbinafine Hives    Patient Measurements: Height: 6' (182.9 cm) Weight: (!) 427 lb 11.1 oz (194 kg) IBW/kg (Calculated) : 77.6 Heparin Dosing Weight: 126.1 kg  Vital Signs: Temp: 98.2 F (36.8 C) (11/12 0624) Temp Source: Oral (11/12 0624) BP: 141/109 (11/12 1107) Pulse Rate: 142 (11/12 1107)  Labs: Recent Labs    06/05/19 1433 06/05/19 2123 06/06/19 0604  HGB 15.5  --   --   HCT 44.8  --   --   PLT 116*  --   --   CREATININE 2.45*  --  2.72*  TROPONINIHS 10 14 15     Estimated Creatinine Clearance: 52.6 mL/min (A) (by C-G formula based on SCr of 2.72 mg/dL (H)).   Medical History: Past Medical History:  Diagnosis Date  . BPH (benign prostatic hyperplasia)   . 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 He was subsequently found to be in atrial fibrillation with RVR. Pharmacy was consulted to start heparin drip. Baseline H&H within normal limits, mild thrombocytopenia. Coags pending.   Goal of Therapy:  Heparin level 0.3-0.7 units/ml Monitor platelets by anticoagulation protocol: Yes   Plan:  -Heparin 4000 unit bolus x1 followed by continuous infusion at 2000 units/hr -HL 6 hours after initiation of infusion -Daily CBC per protocol  [place] Resident 06/06/2019,11:29 AM

## 2019-06-06 NOTE — Progress Notes (Signed)
PROGRESS NOTE    Louis [name]  VFI:[id number] DOB: [birth year] DOA: 06/05/2019 PCP: Patient, No Pcp Per   Brief Narrative:  Louis [name] is a 58 y.o. 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  Patient states he has been having 4 days of fevers and diaphoresis associated with nausea without emesis.  He denies any shortness of breath or cough.  He denies any chest pain, abdominal pain, diarrhea.  He reports decreased urine output than expected.  He does say he has a history of enlarged prostate for which he took finasteride in the past however has not been taking it for some time.    He has been taking Tylenol every 4 hours without relief.  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  He denies any history of tobacco use, alcohol use, or illicit drug use.   Assessment & Plan:   Principal Problem:   Febrile illness Active Problems:   AKI (acute kidney injury) ([place])   Thrombocytopenia ([place])   Community acquired pneumonia   UTI (urinary tract infection)   UTI with fever: Ordered urine culture Adjust antibiotics based on speciation with sensitivities, for now continue with current antibiotics to include azithromycin and ceftriaxone Follow CBC  Acute kidney injury; Unclear etiology likely multifactorial in the setting of UTI dehydration We will hydrate the patient follow labs Follow-up renal ultrasound showed normal renal texture  New onset A. fib: Currently on Cardizem drip Requested 10 mg bolus and increase rate to 15 Echo pending Transition to p.o. once rate control established Cardiology evaluating for anticoagulation, appreciate their consult  Thrombocytopenia: Mild at 116, Recheck a CBC in the morning Evidence of bleeding  DVT prophylaxis: Heparin SQ  Code Status: full    Code Status Orders  (From admission, onward)         Start      Ordered   06/05/19 2235  Full code  Continuous     06/05/19 2235        Code Status History    This patient has a current code status but no historical code status.   Advance Care Planning Activity     Family Communication: Discussed in detail with patient Disposition Plan:   Patient changed to inpatient, requiring IV antiarrhythmics, IV antibiotics, expert subspecialty consultation.  Without these services patient at risk of severe clinical deterioration Consults called: None Admission status: Inpatient   Consultants:   cardiology  Procedures:  Dg Chest 2 View  Result Date: 06/05/2019 CLINICAL DATA:  58 year old male with shortness of breath and fever. EXAM: CHEST - 2 VIEW COMPARISON:  Chest radiograph dated 04/10/2014. FINDINGS: Mild eventration of the right hemidiaphragm similar to prior radiograph. Minimal bibasilar densities, likely atelectatic changes. No focal consolidation, pleural effusion, or pneumothorax. The cardiac silhouette is within normal limits. No acute osseous pathology. IMPRESSION: Right lung base atelectasis.  Infiltrate is less likely. Electronically Signed   By: Anner Crete M.D.   On: 06/05/2019 15:01   US Renal  Result Date: 06/06/2019 CLINICAL DATA:  Acute renal injury EXAM: RENAL / URINARY TRACT ULTRASOUND COMPLETE COMPARISON:  None. FINDINGS: Right Kidney: Renal measurements: 13.9 x 7.9 x 6.6 cm. = volume: 377 mL . Echogenicity within normal limits. No mass or hydronephrosis visualized. Left Kidney: Renal measurements: 13.1 x 7.5 x 6.3 cm = volume: 320: ML. Echogenicity within normal limits. No mass or hydronephrosis visualized. Bladder:  Appears normal for degree of bladder distention. Other: None. IMPRESSION: Unremarkable renal ultrasound. Electronically Signed   By: Alcide Clever M.D.   On: 06/06/2019 02:31     Antimicrobials:   Azithromycin and ceftriaxone day 2   Subjective: Patient reports he feels somewhat better from when he came in [place]  A. fib started last night on a Cardizem drip  Objective: Vitals:   06/06/19 0620 06/06/19 0624 06/06/19 0810 06/06/19 0907  BP:  121/66 120/90   Pulse: (!) 128 80 (!) 175 (!) 159  Resp:  (!) 24 (!) 22   Temp: 98.2 F (36.8 C) 98.2 F (36.8 C)    TempSrc: Oral Oral    SpO2:  95% 95%   Weight:      Height:        Intake/Output Summary (Last 24 hours) at 06/06/2019 1037 Last data filed at 06/06/2019 0229 Gross per 24 hour  Intake 990 ml  Output 100 ml  Net 890 ml   Filed Weights   06/05/19 1429  Weight: (!) 194 kg    Examination:  General exam: Appears calm and comfortable  Respiratory system: Clear to auscultation. Respiratory effort normal. Cardiovascular system: Irregularly irregular rapid rate no murmurs noted although difficult to auscultate given patient's obesity/body habitus Gastrointestinal system: Abdomen is protuberant, soft and nontender. No organomegaly or masses felt. Normal bowel sounds heard. Central nervous system: Alert and oriented. No focal neurological deficits. Extremities: Warm well perfused, neurovascularly intact Skin: Notable small skin abrasions but no rashes, lesions or ulcers Psychiatry: Judgement and insight appear normal. Mood & affect appropriate.     Data Reviewed: I have personally reviewed following labs and imaging studies  CBC: Recent Labs  Lab 06/05/19 1433  WBC 12.1*  NEUTROABS 10.6*  HGB 15.5  HCT 44.8  MCV 90.7  PLT 116*   Basic Metabolic Panel: Recent Labs  Lab 06/05/19 1433 06/06/19 0604  NA 132* 133*  K 3.8 4.0  CL 103 104  CO2 19* 18*  GLUCOSE 125* 118*  BUN 32* 37*  CREATININE 2.45* 2.72*  CALCIUM 8.8* 8.1*  MG  --  2.3   GFR: Estimated Creatinine Clearance: 52.6 mL/min (A) (by C-G formula based on SCr of 2.72 mg/dL (H)). Liver Function Tests: Recent Labs  Lab 06/05/19 1433  AST 27  ALT 19  ALKPHOS 79  BILITOT 2.3*  PROT 6.9  ALBUMIN 3.1*   No results for input(s): LIPASE, AMYLASE in the last  168 hours. No results for input(s): AMMONIA in the last 168 hours. Coagulation Profile: No results for input(s): INR, PROTIME in the last 168 hours. Cardiac Enzymes: No results for input(s): CKTOTAL, CKMB, CKMBINDEX, TROPONINI in the last 168 hours. BNP (last 3 results) No results for input(s): PROBNP in the last 8760 hours. HbA1C: No results for input(s): HGBA1C in the last 72 hours. CBG: No results for input(s): GLUCAP in the last 168 hours. Lipid Profile: No results for input(s): CHOL, HDL, LDLCALC, TRIG, CHOLHDL, LDLDIRECT in the last 72 hours. Thyroid Function Tests: Recent Labs    06/06/19 0604  TSH 3.654   Anemia Panel: No results for input(s): VITAMINB12, FOLATE, FERRITIN, TIBC, IRON, RETICCTPCT in the last 72 hours. Sepsis Labs: Recent Labs  Lab 06/05/19 1655 06/06/19 0604  PROCALCITON  --  3.99  LATICACIDVEN 1.8  --     Recent Results (from the past 240 hour(s))  SARS CORONAVIRUS 2 (TAT 6-24 HRS) Nasopharyngeal Nasopharyngeal Swab     Status: None   Collection Time:  06/05/19  9:23 PM   Specimen: Nasopharyngeal Swab  Result Value Ref Range Status   SARS Coronavirus 2 NEGATIVE NEGATIVE Final    Comment: (NOTE) SARS-CoV-2 target nucleic acids are NOT DETECTED. The SARS-CoV-2 RNA is generally detectable in upper and lower respiratory specimens during the acute phase of infection. Negative results do not preclude SARS-CoV-2 infection, do not rule out co-infections with other pathogens, and should not be used as the sole basis for treatment or other patient management decisions. Negative results must be combined with clinical observations, patient history, and epidemiological information. The expected result is Negative. Fact Sheet for Patients: HairSlick.nohttps://www.fda.gov/media/138098/download Fact Sheet for Healthcare Providers: quierodirigir.comhttps://www.fda.gov/media/138095/download This test is not yet approved or cleared by the [place] FDA and  has been authorized for  detection and/or diagnosis of SARS-CoV-2 by FDA under an Emergency Use Authorization (EUA). This EUA will remain  in effect (meaning this test can be used) for the duration of the COVID-19 declaration under Section 56 4(b)(1) of the Act, 21 U.S.C. section 360bbb-3(b)(1), unless the authorization is terminated or revoked sooner. Performed at [facility], [address] [place], [place] [address]   CULTURE, BLOOD (ROUTINE X 2) w Reflex to ID Panel     Status: None (Preliminary result)   Collection Time: 06/06/19  6:04 AM   Specimen: BLOOD  Result Value Ref Range Status   Specimen Description BLOOD BLOOD RIGHT HAND  Final   Special Requests   Final    BOTTLES DRAWN AEROBIC AND ANAEROBIC Blood Culture adequate volume   Culture   Final    NO GROWTH < 12 HOURS Performed at [facility], [address] [place], [place] [address]    Report Status PENDING  Incomplete  CULTURE, BLOOD (ROUTINE X 2) w Reflex to ID Panel     Status: None (Preliminary result)   Collection Time: 06/06/19  6:11 AM   Specimen: BLOOD  Result Value Ref Range Status   Specimen Description BLOOD BLOOD LEFT HAND  Final   Special Requests   Final    BOTTLES DRAWN AEROBIC AND ANAEROBIC Blood Culture results may not be optimal due to an excessive volume of blood received in culture bottles   Culture   Final    NO GROWTH < 12 HOURS Performed at [facility], [address] [place], [place] [address]    Report Status PENDING  Incomplete         Radiology Studies: Dg Chest 2 View  Result Date: 06/05/2019 CLINICAL DATA:  58 year old male with shortness of breath and fever. EXAM: CHEST - 2 VIEW COMPARISON:  Chest radiograph dated 04/10/2014. FINDINGS: Mild eventration of the right hemidiaphragm similar to prior radiograph. Minimal bibasilar densities, likely atelectatic changes. No focal consolidation, pleural effusion, or pneumothorax. The cardiac silhouette is within normal limits. No  acute osseous pathology. IMPRESSION: Right lung base atelectasis.  Infiltrate is less likely. Electronically Signed   By: Elgie CollardArash  Radparvar M.D.   On: 06/05/2019 15:01   [place] Renal  Result Date: 06/06/2019 CLINICAL DATA:  Acute renal injury EXAM: RENAL / URINARY TRACT ULTRASOUND COMPLETE COMPARISON:  None. FINDINGS: Right Kidney: Renal measurements: 13.9 x 7.9 x 6.6 cm. = volume: 377 mL . Echogenicity within normal limits. No mass or hydronephrosis visualized. Left Kidney: Renal measurements: 13.1 x 7.5 x 6.3 cm = volume: 320: ML. Echogenicity within normal limits. No mass or hydronephrosis visualized. Bladder: Appears normal for degree of bladder distention. Other: None. IMPRESSION: Unremarkable renal ultrasound. Electronically  Signed   By: Alcide Clever M.D.   On: 06/06/2019 02:31        Scheduled Meds: . heparin  5,000 Units Subcutaneous Q8H   Continuous Infusions: . sodium chloride 125 mL/hr at 06/06/19 0502  . azithromycin Stopped (06/06/19 0329)  . cefTRIAXone (ROCEPHIN)  IV Stopped (06/05/19 2204)  . diltiazem (CARDIZEM) infusion 15 mg/hr (06/06/19 0958)     LOS: 0 days    Time spent: 35 min    Burke Keels, MD [facility]  If 7PM-7AM, please contact night-coverage  06/06/2019, 10:37 AM

## 2019-06-06 NOTE — ED Notes (Signed)
Per Dennison Bulla RN and NP pts heart rate in the 170s and is in need on Cardizem. This RN to room to give medication per order in [facility]. EKG was completed as well by Melody, EDT.

## 2019-06-06 NOTE — Consult Note (Signed)
ANTICOAGULATION CONSULT NOTE - Initial Consult  Pharmacy Consult for Heparin Indication: atrial fibrillation  Allergies  Allergen Reactions  . Terbinafine Hives    Patient Measurements: Height: 6' (182.9 cm) Weight: (!) 427 lb 11.1 oz (194 kg) IBW/kg (Calculated) : 77.6 Heparin Dosing Weight: 126.1 kg  Vital Signs: BP: 134/118 (11/12 1902) Pulse Rate: 132 (11/12 1902)  Labs: Recent Labs    06/05/19 1433 06/05/19 2123 06/06/19 0604 06/06/19 1223 06/06/19 1954  HGB 15.5  --   --  14.0  --   HCT 44.8  --   --  39.9  --   PLT 116*  --   --  106*  --   APTT  --   --   --  31  --   LABPROT  --   --   --  14.5  --   INR  --   --   --  1.1  --   HEPARINUNFRC  --   --   --   --  0.57  CREATININE 2.45*  --  2.72*  --   --   TROPONINIHS 10 14 15 13   --     Estimated Creatinine Clearance: 52.6 mL/min (A) (by C-G formula based on SCr of 2.72 mg/dL (H)).   Medical History: Past Medical History:  Diagnosis Date  . BPH (benign prostatic hyperplasia)   . 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 He was subsequently found to be in atrial fibrillation with RVR. Pharmacy was consulted to start heparin drip. Baseline H&H within normal limits, mild thrombocytopenia. Coags pending.   11/12 1954 HL = 0.57 therapeutic x 1   Goal of Therapy:  Heparin level 0.3-0.7 units/ml Monitor platelets by anticoagulation protocol: Yes   Plan:  - Will continue current rate of 2000 units/hr -HL in 6 hours for confirmatory level -Daily CBC per protocol  Lu Duffel, PharmD, BCPS Clinical Pharmacist 06/06/2019 8:35 PM

## 2019-06-07 LAB — URINE CULTURE

## 2019-06-07 LAB — T4: T4, Total: 6.3 ug/dL (ref 4.5–12.0)

## 2019-06-07 LAB — BASIC METABOLIC PANEL
Anion gap: 11 (ref 5–15)
BUN: 54 mg/dL — ABNORMAL HIGH (ref 6–20)
CO2: 18 mmol/L — ABNORMAL LOW (ref 22–32)
Calcium: 8 mg/dL — ABNORMAL LOW (ref 8.9–10.3)
Chloride: 105 mmol/L (ref 98–111)
Creatinine, Ser: 2.77 mg/dL — ABNORMAL HIGH (ref 0.61–1.24)
GFR calc Af Amer: 28 mL/min — ABNORMAL LOW (ref >60)
GFR calc non Af Amer: 24 mL/min — ABNORMAL LOW (ref >60)
Glucose, Bld: 132 mg/dL — ABNORMAL HIGH (ref 70–99)
Potassium: 4 mmol/L (ref 3.5–5.1)
Sodium: 134 mmol/L — ABNORMAL LOW (ref 135–145)

## 2019-06-07 LAB — CBC
HCT: 39.1 % (ref 39.0–52.0)
Hemoglobin: 13.4 g/dL (ref 13.0–17.0)
MCH: 31.5 pg (ref 26.0–34.0)
MCHC: 34.3 g/dL (ref 30.0–36.0)
MCV: 91.8 fL (ref 80.0–100.0)
Platelets: 126 10*3/uL — ABNORMAL LOW (ref 150–400)
RBC: 4.26 MIL/uL (ref 4.22–5.81)
RDW: 13.1 % (ref 11.5–15.5)
WBC: 9.6 10*3/uL (ref 4.0–10.5)
nRBC: 0 % (ref 0.0–0.2)

## 2019-06-07 LAB — HEPARIN LEVEL (UNFRACTIONATED)
Heparin Unfractionated: <0.1 IU/mL — ABNORMAL LOW (ref 0.30–0.70)
Heparin Unfractionated: 0.17 IU/mL — ABNORMAL LOW (ref 0.30–0.70)

## 2019-06-07 MED ORDER — HEPARIN BOLUS VIA INFUSION
2000.0000 [IU] | Freq: Once | INTRAVENOUS | Status: AC
  Administered 2019-06-07: 2000 [IU] via INTRAVENOUS
  Filled 2019-06-07: qty 2000

## 2019-06-07 MED ORDER — DILTIAZEM HCL 30 MG PO TABS
60.0000 mg | ORAL_TABLET | Freq: Four times a day (QID) | ORAL | Status: DC
  Administered 2019-06-07 – 2019-06-09 (×10): 60 mg via ORAL
  Filled 2019-06-07 (×2): qty 2
  Filled 2019-06-07: qty 1
  Filled 2019-06-07: qty 2
  Filled 2019-06-07 (×2): qty 1
  Filled 2019-06-07 (×4): qty 2
  Filled 2019-06-07: qty 1
  Filled 2019-06-07: qty 2
  Filled 2019-06-07 (×2): qty 1

## 2019-06-07 MED ORDER — APIXABAN 5 MG PO TABS
5.0000 mg | ORAL_TABLET | Freq: Two times a day (BID) | ORAL | Status: DC
  Administered 2019-06-07 – 2019-06-10 (×6): 5 mg via ORAL
  Filled 2019-06-07 (×7): qty 1

## 2019-06-07 MED ORDER — HEPARIN BOLUS VIA INFUSION
1500.0000 [IU] | Freq: Once | INTRAVENOUS | Status: AC
  Administered 2019-06-07: 1500 [IU] via INTRAVENOUS

## 2019-06-07 MED ORDER — SODIUM CHLORIDE 0.9 % IV SOLN
INTRAVENOUS | Status: DC | PRN
  Administered 2019-06-07 – 2019-06-10 (×6): 10 mL via INTRAVENOUS

## 2019-06-07 MED ORDER — APIXABAN 2.5 MG PO TABS
2.5000 mg | ORAL_TABLET | Freq: Two times a day (BID) | ORAL | Status: DC
  Filled 2019-06-07: qty 1

## 2019-06-07 MED ORDER — APIXABAN 5 MG PO TABS: 5.0000 mg | ORAL_TABLET | Freq: Two times a day (BID) | ORAL | Status: DC

## 2019-06-07 NOTE — Progress Notes (Signed)
Patient Name: Louis [name] Date of Encounter: 06/07/2019  Hospital Problem List     Principal Problem:   Febrile illness Active Problems:   AKI (acute kidney injury) ([facility])   Thrombocytopenia ([facility])   Community acquired pneumonia   UTI (urinary tract infection)    Patient Profile     58 year old male who presented to emergency room with complaints of community-acquired pneumonia, acute renal insufficiency, thrombocytopenia and atrial fibrillation with rapid ventricular response.  Rate improved with IV Cardizem and p.o. metoprolol.  Echocardiogram showed preserved LV function although technique difficult due to body habitus.  Subjective   Feels less short of breath.  Inpatient Medications    . apixaban  2.5 mg Oral BID  . diltiazem  60 mg Oral Q6H  . metoprolol tartrate  25 mg Oral BID    Vital Signs    Vitals:   06/07/19 1108 06/07/19 1209 06/07/19 1236 06/07/19 1417  BP: 115/75 129/73    Pulse: 95 90  62  Resp: (!) 22 20  (!) 21  Temp:   98.5 F (36.9 C)   TempSrc:   Oral   SpO2: 95% 96%  96%  Weight:      Height:        Intake/Output Summary (Last 24 hours) at 06/07/2019 1425 Last data filed at 06/07/2019 0300 Gross per 24 hour  Intake 310.32 ml  Output 200 ml  Net 110.32 ml   Filed Weights   06/05/19 1429  Weight: (!) 194 kg    Physical Exam    GEN: Well nourished, well developed, in no acute distress.  HEENT: normal.  Neck: Supple, no JVD, carotid bruits, or masses. Cardiac: Irregular regular rhythm Respiratory:  Respirations regular and unlabored, clear to auscultation bilaterally. GI: Difficult to assess due to body habitus MS: no deformity or atrophy. Skin: warm and dry, no rash. Neuro:  Strength and sensation are intact. Psych: Normal affect.  Labs    CBC Recent Labs    06/05/19 1433 06/06/19 1223 06/07/19 0215  WBC 12.1* 9.6 9.6  NEUTROABS 10.6* 7.8*  --   HGB 15.5 14.0 13.4  HCT 44.8 39.9 39.1  MCV 90.7 89.3 91.8  PLT  116* 106* 126*   Basic Metabolic Panel Recent Labs    69/62/95 1433 06/06/19 0604  NA 132* 133*  K 3.8 4.0  CL 103 104  CO2 19* 18*  GLUCOSE 125* 118*  BUN 32* 37*  CREATININE 2.45* 2.72*  CALCIUM 8.8* 8.1*  MG  --  2.3   Liver Function Tests Recent Labs    06/05/19 1433  AST 27  ALT 19  ALKPHOS 79  BILITOT 2.3*  PROT 6.9  ALBUMIN 3.1*   No results for input(s): LIPASE, AMYLASE in the last 72 hours. Cardiac Enzymes No results for input(s): CKTOTAL, CKMB, CKMBINDEX, TROPONINI in the last 72 hours. BNP No results for input(s): BNP in the last 72 hours. D-Dimer No results for input(s): DDIMER in the last 72 hours. Hemoglobin A1C No results for input(s): HGBA1C in the last 72 hours. Fasting Lipid Panel No results for input(s): CHOL, HDL, LDLCALC, TRIG, CHOLHDL, LDLDIRECT in the last 72 hours. Thyroid Function Tests Recent Labs    06/06/19 0604 06/06/19 1223  TSH 3.654  --   T4TOTAL  --  6.3    Telemetry    Atrial fibrillation with variable ventricular response  ECG    Atrial fibrillation with rapid ventricular response with no ischemic changes.  Radiology  Dg Chest 2 View  Result Date: 06/05/2019 CLINICAL DATA:  58 year old male with shortness of breath and fever. EXAM: CHEST - 2 VIEW COMPARISON:  Chest radiograph dated 04/10/2014. FINDINGS: Mild eventration of the right hemidiaphragm similar to prior radiograph. Minimal bibasilar densities, likely atelectatic changes. No focal consolidation, pleural effusion, or pneumothorax. The cardiac silhouette is within normal limits. No acute osseous pathology. IMPRESSION: Right lung base atelectasis.  Infiltrate is less likely. Electronically Signed   By: Anner Crete M.D.   On: 06/05/2019 15:01   US Renal  Result Date: 06/06/2019 CLINICAL DATA:  Acute renal injury EXAM: RENAL / URINARY TRACT ULTRASOUND COMPLETE COMPARISON:  None. FINDINGS: Right Kidney: Renal measurements: 13.9 x 7.9 x 6.6 cm. = volume: 377  mL . Echogenicity within normal limits. No mass or hydronephrosis visualized. Left Kidney: Renal measurements: 13.1 x 7.5 x 6.3 cm = volume: 320: ML. Echogenicity within normal limits. No mass or hydronephrosis visualized. Bladder: Appears normal for degree of bladder distention. Other: None. IMPRESSION: Unremarkable renal ultrasound. Electronically Signed   By: Inez Catalina M.D.   On: 06/06/2019 02:31    Assessment & Plan    58 year old male with history of morbid obesity, new onset atrial fibrillation admitted with shortness of breath, acute renal insufficiency and evidence of possible community-acquired pneumonia.  Atrial fibrillation-rate is improved but still in A. fib.  Was on Cardizem at 15 mg/h and metoprolol tartrate 25 mg twice daily.  Have placed on Cardizem 60 mg p.o. every 6 and continue with metoprolol tartrate.  Weaning off of IV Cardizem.  We will continue with p.o. Cardizem at every 6 hours dosing until stable doses obtained then will convert to long-acting.  We will continue with metoprolol at current rate.  Will discontinue heparin and start on Eliquis.  Will use 2.5 mg twice daily due to renal insufficiency.  Will need to closely follow the renal function to determine whether or not to continue with this.  Alternatively if renal function continues to worsen, warfarin be the only choice.  Echo showed no significant structural abnormalities.  Ruled out for myocardial infarction with high-sensitivity troponins x3 which were unremarkable.  Magnesium 2.3.  CHA2DS2-VASc score is likely 1 secondary to hypertension.  Acute renal insufficiency.- Serum creatinine 2.72 up from 2.45 yesterday.  Value pending for today.  Will recheck this later on today.  Adjustments of Eliquis based on the results of this.  Sleep apnea-no definitive diagnosis however given patient's body habitus this will need to be worked up as an outpatient as a possible partial etiology of his atrial fibrillation   Urinary  tract infection-currently being treated  Thrombocytopenia we will closely follow.  Stopped heparin although no evidence of HIT.Joyce Gross Neesha Langton MD 06/07/2019, 2:25 PM  Pager: [phone]

## 2019-06-07 NOTE — ED Notes (Signed)
Informed stephanie charge RN pt on way.  Gave them number and can call if questions.

## 2019-06-07 NOTE — ED Notes (Signed)
Pt remains maxed on dilt gtt.  Alert and oriented.  Talked with dr Ubaldo Glassing about PO dilt and orders placed.

## 2019-06-07 NOTE — ED Notes (Signed)
Pt sitting on side of bed. sats 88% [place] 2 L.  Increased to 4 L [place].  Pt had increased WOB.

## 2019-06-07 NOTE — Consult Note (Signed)
ANTICOAGULATION CONSULT NOTE -  Pharmacy Consult for Heparin Indication: atrial fibrillation  Allergies  Allergen Reactions  . Terbinafine Hives    Patient Measurements: Height: 6' (182.9 cm) Weight: (!) 427 lb 11.1 oz (194 kg) IBW/kg (Calculated) : 77.6 Heparin Dosing Weight: 126.1 kg  Vital Signs: BP: 111/87 (11/13 0047) Pulse Rate: 127 (11/13 0047)  Labs: Recent Labs    06/05/19 1433 06/05/19 2123 06/06/19 0604 06/06/19 1223 06/06/19 1954 06/07/19 0215  HGB 15.5  --   --  14.0  --  13.4  HCT 44.8  --   --  39.9  --  39.1  PLT 116*  --   --  106*  --  126*  APTT  --   --   --  31  --   --   LABPROT  --   --   --  14.5  --   --   INR  --   --   --  1.1  --   --   HEPARINUNFRC  --   --   --   --  0.57 <0.10*  CREATININE 2.45*  --  2.72*  --   --   --   TROPONINIHS 10 14 15 13   --   --     Estimated Creatinine Clearance: 52.6 mL/min (A) (by C-G formula based on SCr of 2.72 mg/dL (H)).   Medical History: Past Medical History:  Diagnosis Date  . BPH (benign prostatic hyperplasia)   . 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 He was subsequently found to be in atrial fibrillation with RVR. Pharmacy was consulted to start heparin drip. Baseline H&H within normal limits, mild thrombocytopenia. Coags pending.   11/12 1954 HL = 0.57 therapeutic x 1  11/13 0215 HL = < 0.10, subtherapeutic - confirmed w/ RN no problems w/ infusion  Goal of Therapy:  Heparin level 0.3-0.7 units/ml Monitor platelets by anticoagulation protocol: Yes   Plan:  - Will rebolus w/ heparin 2000 units then increase rate to 2300 units/hr -Recheck HL in 6 hours -Daily CBC per protocol  Ena Dawley, PharmD Clinical  Pharmacist 06/07/2019 3:28 AM

## 2019-06-07 NOTE — Progress Notes (Signed)
Patient Name: Louis [name] Date of Encounter: 06/08/2019  Hospital Problem List     Principal Problem:   Febrile illness Active Problems:   AKI (acute kidney injury) (HCC)   Thrombocytopenia (HCC)   Community acquired pneumonia   UTI (urinary tract infection)   A-fib [facility])    Patient Profile     58 year old male who presented to emergency room with complaints of community-acquired pneumonia, acute renal insufficiency, thrombocytopenia and atrial fibrillation with rapid ventricular response.  Rate improved with IV Cardizem and p.o. metoprolol.  Echocardiogram showed preserved LV function although technique difficult due to body habitus.  Subjective   Less short of breath.  Still in atrial fibrillation with improved rate control.  Inpatient Medications    . apixaban  5 mg Oral BID  . diltiazem  60 mg Oral Q6H  . metoprolol tartrate  25 mg Oral BID    Vital Signs    Vitals:   06/08/19 0418 06/08/19 0619 06/08/19 0636 06/08/19 0816  BP: 109/69 115/83  98/73  Pulse: (!) 104 (!) 115 99 (!) 102  Resp:      Temp: 98.6 F (37 C)   (!) 97.5 F (36.4 C)  TempSrc: Oral   Oral  SpO2: 99%   100%  Weight:      Height:        Intake/Output Summary (Last 24 hours) at 06/08/2019 0956 Last data filed at 06/08/2019 0657 Gross per 24 hour  Intake 346.21 ml  Output 2300 ml  Net -1953.79 ml   Filed Weights   06/05/19 1429 06/07/19 1555  Weight: (!) 194 kg (!) 199.1 kg    Physical Exam    GEN: Well nourished, well developed, in no acute distress.  HEENT: normal.  Neck: Supple, no JVD, carotid bruits, or masses. Cardiac: Irregular regular rhythm.  No murmurs. Respiratory:  Respirations regular and unlabored, clear to auscultation bilaterally. GI: Soft, nontender, nondistended, BS + x 4. MS: no deformity or atrophy. Skin: warm and dry, no rash. Neuro:  Strength and sensation are intact. Psych: Normal affect.  Labs    CBC Recent Labs    06/05/19 1433  06/06/19 1223 06/07/19 0215  WBC 12.1* 9.6 9.6  NEUTROABS 10.6* 7.8*  --   HGB 15.5 14.0 13.4  HCT 44.8 39.9 39.1  MCV 90.7 89.3 91.8  PLT 116* 106* 126*   Basic Metabolic Panel Recent Labs    53/97/67 0604 06/07/19 1522 06/08/19 0643  NA 133* 134* 133*  K 4.0 4.0 3.7  CL 104 105 106  CO2 18* 18* 17*  GLUCOSE 118* 132* 111*  BUN 37* 54* 56*  CREATININE 2.72* 2.77* 2.46*  CALCIUM 8.1* 8.0* 7.8*  MG 2.3  --   --    Liver Function Tests Recent Labs    06/05/19 1433  AST 27  ALT 19  ALKPHOS 79  BILITOT 2.3*  PROT 6.9  ALBUMIN 3.1*   No results for input(s): LIPASE, AMYLASE in the last 72 hours. Cardiac Enzymes No results for input(s): CKTOTAL, CKMB, CKMBINDEX, TROPONINI in the last 72 hours. BNP No results for input(s): BNP in the last 72 hours. D-Dimer No results for input(s): DDIMER in the last 72 hours. Hemoglobin A1C No results for input(s): HGBA1C in the last 72 hours. Fasting Lipid Panel No results for input(s): CHOL, HDL, LDLCALC, TRIG, CHOLHDL, LDLDIRECT in the last 72 hours. Thyroid Function Tests Recent Labs    06/06/19 0604 06/06/19 1223  TSH 3.654  --  T4TOTAL  --  6.3    Telemetry    Atrial fibrillation with variable ventricular response  ECG    Atrial fibrillation with rapid ventricular response  Radiology    Dg Chest 2 View  Result Date: 06/05/2019 CLINICAL DATA:  58 year old male with shortness of breath and fever. EXAM: CHEST - 2 VIEW COMPARISON:  Chest radiograph dated 04/10/2014. FINDINGS: Mild eventration of the right hemidiaphragm similar to prior radiograph. Minimal bibasilar densities, likely atelectatic changes. No focal consolidation, pleural effusion, or pneumothorax. The cardiac silhouette is within normal limits. No acute osseous pathology. IMPRESSION: Right lung base atelectasis.  Infiltrate is less likely. Electronically Signed   By: Anner Crete M.D.   On: 06/05/2019 15:01   US Renal  Result Date:  06/06/2019 CLINICAL DATA:  Acute renal injury EXAM: RENAL / URINARY TRACT ULTRASOUND COMPLETE COMPARISON:  None. FINDINGS: Right Kidney: Renal measurements: 13.9 x 7.9 x 6.6 cm. = volume: 377 mL . Echogenicity within normal limits. No mass or hydronephrosis visualized. Left Kidney: Renal measurements: 13.1 x 7.5 x 6.3 cm = volume: 320: ML. Echogenicity within normal limits. No mass or hydronephrosis visualized. Bladder: Appears normal for degree of bladder distention. Other: None. IMPRESSION: Unremarkable renal ultrasound. Electronically Signed   By: Inez Catalina M.D.   On: 06/06/2019 02:31    Assessment & Plan    58 year old male with history of morbid obesity, new onset atrial fibrillation admitted with shortness of breath, acute renal insufficiency and evidence of possible community-acquired pneumonia.  Atrial fibrillation-rate is improved but still in A. fib.   Have placed on Cardizem 60 mg p.o. every 6 and continue with metoprolol tartrate.    We will continue with p.o. Cardizem at every 6 hours dosing until stable doses obtained then will convert to long-acting.  Will likely need at least 240 mg daily given body size. We will continue with metoprolol at current rate of 25 mg twice daily.  Will discontinue heparin and start on Eliquis.  Will use 5 mg twice daily and follow for evidence of bleeding.  Will need to closely follow the renal function while on this preparation.   Creatinine improved to 2.46 with a GFR of 28.  Echo showed no significant structural abnormalities.  Ruled out for myocardial infarction with high-sensitivity troponins x3 which were unremarkable.  Magnesium 2.3.  CHA2DS2-VASc score is likely 1 secondary to hypertension.  Acute renal insufficiency.- Serum creatinine improved to 2.46 down from 2.77 yesterday.   Continue to follow.  Sleep apnea-no definitive diagnosis however given patient's body habitus this will need to be worked up as an outpatient as a possible partial  etiology of his atrial fibrillation   Urinary tract infection-currently being treated  Thrombocytopenia-improved to 126. Stopped heparin although no evidence of HIT.Joyce Gross Aamna Mallozzi MD 06/08/2019, 9:56 AM  Pager: [phone]

## 2019-06-07 NOTE — Progress Notes (Signed)
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 He denies any shortness of breath or cough. He denies any chest pain, abdominal pain, diarrhea. He reports decreased urine output than expected. He does say he has a history of enlarged prostate for which he took finasteride in the past however has not been taking it for some time.   He has been taking Tylenol every 4 hours without relief. 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 He denies any history of tobacco use, alcohol use, or illicit drug use.   Assessment & Plan:   Principal Problem:   Febrile illness Active Problems:   AKI (acute kidney injury) (HCC)   Thrombocytopenia (HCC)   Community acquired pneumonia   UTI (urinary tract infection)   UTI with fever: Re=ordered urine culture Adjust antibiotics based on speciation with sensitivities, for now continue with current antibiotics to include azithromycin and ceftriaxone CBC white count normal x2 days  Acute kidney injury; Unclear etiology likely multifactorial in the setting of UTI dehydration We will hydrate the patient follow labs BMP pending 2.45>2.72 Follow-up renal ultrasound showed normal renal texture  New onset A. fib: Cardizem drip transitioning to p.o. Cardiology adjusting nodal blocking agents Echo Limited read secondary to A. fib, EF noted to be normal at 55 to 60% diastolic function could not be evaluated  Thrombocytopenia: Mild at 126 Recheck a CBC in the morning Evidence of bleeding  DVT prophylaxis: Heparin SQ  Code Status: full    Code Status Orders  (From  admission, onward)         Start     Ordered   06/05/19 2235  Full code  Continuous     06/05/19 2235        Code Status History    This patient has a current code status but no historical code status.   Advance Care Planning Activity     Family Communication: In detail with patient Disposition Plan:   Main inpatient for continued treatment with IV antibiotics, IV rate control medications for A. fib, continued IV heparin for stroke prophylaxis, expert subspecialty consultation.  Patient not stable for discharge. Consults called: None Admission status: Inpatient   Consultants:   Cardiology  Procedures:  Dg Chest 2 View  Result Date: 06/05/2019 CLINICAL DATA:  58 year old male with shortness of breath and fever. EXAM: CHEST - 2 VIEW COMPARISON:  Chest radiograph dated 04/10/2014. FINDINGS: Mild eventration of the right hemidiaphragm similar to prior radiograph. Minimal bibasilar densities, likely atelectatic changes. No focal consolidation, pleural effusion, or pneumothorax. The cardiac silhouette is within normal limits. No acute osseous pathology. IMPRESSION: Right lung base atelectasis.  Infiltrate is less likely. Electronically Signed   By: Elgie CollardArash  Radparvar M.D.   On: 06/05/2019 15:01   [place] Renal  Result Date: 06/06/2019 CLINICAL DATA:  Acute renal injury EXAM: RENAL / URINARY TRACT ULTRASOUND COMPLETE COMPARISON:  None. FINDINGS: Right Kidney: Renal measurements: 13.9 x 7.9 x 6.6 cm. = volume: 377 mL . Echogenicity within normal limits. No mass or hydronephrosis visualized. Left Kidney: Renal measurements: 13.1 x 7.5 x 6.3 cm = volume: 320: ML. Echogenicity within normal limits. No mass or  hydronephrosis visualized. Bladder: Appears normal for degree of bladder distention. Other: None. IMPRESSION: Unremarkable renal ultrasound. Electronically Signed   By: Alcide Clever M.D.   On: 06/06/2019 02:31     Antimicrobials:   Azithromycin and ceftriaxone day #3   Subjective:  Patient reports he continues to feel better, he does have poor insight and poor recall No acute events overnight On heparin drip at the time of my evaluation this morning  Objective: Vitals:   06/07/19 1000 06/07/19 1108 06/07/19 1209 06/07/19 1236  BP: 131/90 115/75 129/73   Pulse: (!) 121 95 90   Resp: (!) 24 (!) 22 20   Temp:    98.5 F (36.9 C)  TempSrc:    Oral  SpO2: (!) 87% 95% 96%   Weight:      Height:        Intake/Output Summary (Last 24 hours) at 06/07/2019 1412 Last data filed at 06/07/2019 0300 Gross per 24 hour  Intake 310.32 ml  Output 200 ml  Net 110.32 ml   Filed Weights   06/05/19 1429  Weight: (!) 194 kg    Examination:  General exam: Appears calm and comfortable  Respiratory system: Clear to auscultation. Respiratory effort normal. Cardiovascular system: Irregularly irregular although rate controlled, no murmurs noted, limited exam secondary to body habitus  gastrointestinal system: Abdomen is protuberant soft and nontender. No organomegaly or masses felt. Normal bowel sounds heard. Central nervous system: Alert and oriented. No focal neurological deficits. Extremities: Warm well perfused, neurovascularly intact  skin: Multiple small skin abrasions but no rashes or purulent sites Psychiatry: Judgement and insight appear limited. Mood & affect appropriate.     Data Reviewed: I have personally reviewed following labs and imaging studies  CBC: Recent Labs  Lab 06/05/19 1433 06/06/19 1223 06/07/19 0215  WBC 12.1* 9.6 9.6  NEUTROABS 10.6* 7.8*  --   HGB 15.5 14.0 13.4  HCT 44.8 39.9 39.1  MCV 90.7 89.3 91.8  PLT 116* 106* 126*   Basic Metabolic Panel: Recent Labs  Lab 06/05/19 1433 06/06/19 0604  NA 132* 133*  K 3.8 4.0  CL 103 104  CO2 19* 18*  GLUCOSE 125* 118*  BUN 32* 37*  CREATININE 2.45* 2.72*  CALCIUM 8.8* 8.1*  MG  --  2.3   GFR: Estimated Creatinine Clearance: 52.6 mL/min (A) (by C-G formula based on SCr of 2.72 mg/dL  (H)). Liver Function Tests: Recent Labs  Lab 06/05/19 1433  AST 27  ALT 19  ALKPHOS 79  BILITOT 2.3*  PROT 6.9  ALBUMIN 3.1*   No results for input(s): LIPASE, AMYLASE in the last 168 hours. No results for input(s): AMMONIA in the last 168 hours. Coagulation Profile: Recent Labs  Lab 06/06/19 1223  INR 1.1   Cardiac Enzymes: No results for input(s): CKTOTAL, CKMB, CKMBINDEX, TROPONINI in the last 168 hours. BNP (last 3 results) No results for input(s): PROBNP in the last 8760 hours. HbA1C: No results for input(s): HGBA1C in the last 72 hours. CBG: No results for input(s): GLUCAP in the last 168 hours. Lipid Profile: No results for input(s): CHOL, HDL, LDLCALC, TRIG, CHOLHDL, LDLDIRECT in the last 72 hours. Thyroid Function Tests: Recent Labs    06/06/19 0604 06/06/19 1223  TSH 3.654  --   T4TOTAL  --  6.3   Anemia Panel: No results for input(s): VITAMINB12, FOLATE, FERRITIN, TIBC, IRON, RETICCTPCT in the last 72 hours. Sepsis Labs: Recent Labs  Lab 06/05/19 1655 06/06/19 0604  PROCALCITON  --  3.99  LATICACIDVEN 1.8  --     Recent Results (from the past 240 hour(s))  SARS CORONAVIRUS 2 (TAT 6-24 HRS) Nasopharyngeal Nasopharyngeal Swab     Status: None   Collection Time: 06/05/19  9:23 PM   Specimen: Nasopharyngeal Swab  Result Value Ref Range Status   SARS Coronavirus 2 NEGATIVE NEGATIVE Final    Comment: (NOTE) SARS-CoV-2 target nucleic acids are NOT DETECTED. The SARS-CoV-2 RNA is generally detectable in upper and lower respiratory specimens during the acute phase of infection. Negative results do not preclude SARS-CoV-2 infection, do not rule out co-infections with other pathogens, and should not be used as the sole basis for treatment or other patient management decisions. Negative results must be combined with clinical observations, patient history, and epidemiological information. The expected result is Negative. Fact Sheet for Patients:  SugarRoll.be Fact Sheet for Healthcare Providers: https://www.woods-mathews.com/ This test is not yet approved or cleared by the [place] FDA and  has been authorized for detection and/or diagnosis of SARS-CoV-2 by FDA under an Emergency Use Authorization (EUA). This EUA will remain  in effect (meaning this test can be used) for the duration of the COVID-19 declaration under Section 56 4(b)(1) of the Act, 21 U.S.C. section 360bbb-3(b)(1), unless the authorization is terminated or revoked sooner. Performed at [facility], [place] [address], [place], [place] [address]   Urine Culture     Status: Abnormal   Collection Time: 06/05/19  9:23 PM   Specimen: Urine, Random  Result Value Ref Range Status   Specimen Description   Final    URINE, RANDOM Performed at [facility], [address] [place], [place] [address]    Special Requests   Final    NONE Performed at [facility], [address] [place], [place] [address]    Culture MULTIPLE SPECIES PRESENT, SUGGEST RECOLLECTION (A)  Final   Report Status 06/07/2019 FINAL  Final  CULTURE, BLOOD (ROUTINE X 2) w Reflex to ID Panel     Status: None (Preliminary result)   Collection Time: 06/06/19  6:04 AM   Specimen: BLOOD  Result Value Ref Range Status   Specimen Description BLOOD BLOOD RIGHT HAND  Final   Special Requests   Final    BOTTLES DRAWN AEROBIC AND ANAEROBIC Blood Culture adequate volume   Culture   Final    NO GROWTH 1 DAY Performed at [facility], [address] [place], [place] [address]    Report Status PENDING  Incomplete  CULTURE, BLOOD (ROUTINE X 2) w Reflex to ID Panel     Status: None (Preliminary result)   Collection Time: 06/06/19  6:11 AM   Specimen: BLOOD  Result Value Ref Range Status   Specimen Description BLOOD BLOOD LEFT HAND  Final   Special Requests   Final    BOTTLES DRAWN AEROBIC AND ANAEROBIC Blood Culture results may  not be optimal due to an excessive volume of blood received in culture bottles   Culture   Final    NO GROWTH 1 DAY Performed at [facility], [address] [place], [place] [address]    Report Status PENDING  Incomplete         Radiology Studies: Dg Chest 2 View  Result Date: 06/05/2019 CLINICAL DATA:  58 year old male with shortness of breath and fever. EXAM: CHEST - 2 VIEW COMPARISON:  Chest radiograph dated 04/10/2014. FINDINGS: Mild eventration of the right hemidiaphragm similar to prior radiograph. Minimal bibasilar densities, likely  atelectatic changes. No focal consolidation, pleural effusion, or pneumothorax. The cardiac silhouette is within normal limits. No acute osseous pathology. IMPRESSION: Right lung base atelectasis.  Infiltrate is less likely. Electronically Signed   By: Elgie Collard M.D.   On: 06/05/2019 15:01   US Renal  Result Date: 06/06/2019 CLINICAL DATA:  Acute renal injury EXAM: RENAL / URINARY TRACT ULTRASOUND COMPLETE COMPARISON:  None. FINDINGS: Right Kidney: Renal measurements: 13.9 x 7.9 x 6.6 cm. = volume: 377 mL . Echogenicity within normal limits. No mass or hydronephrosis visualized. Left Kidney: Renal measurements: 13.1 x 7.5 x 6.3 cm = volume: 320: ML. Echogenicity within normal limits. No mass or hydronephrosis visualized. Bladder: Appears normal for degree of bladder distention. Other: None. IMPRESSION: Unremarkable renal ultrasound. Electronically Signed   By: Alcide Clever M.D.   On: 06/06/2019 02:31        Scheduled Meds: . apixaban  2.5 mg Oral BID  . diltiazem  60 mg Oral Q6H  . metoprolol tartrate  25 mg Oral BID   Continuous Infusions: . azithromycin Stopped (06/07/19 0500)  . cefTRIAXone (ROCEPHIN)  IV Stopped (06/06/19 2308)  . diltiazem (CARDIZEM) infusion 5 mg/hr (06/07/19 1335)     LOS: 1 day    Time spent: 35  min    Burke Keels, MD [facility]  If 7PM-7AM, please contact night-coverage   06/07/2019, 2:12 PM

## 2019-06-07 NOTE — ED Notes (Addendum)
Cardiology at bedside. Pt given lunch tray, eating currently.

## 2019-06-07 NOTE — Consult Note (Signed)
ANTICOAGULATION CONSULT NOTE -  Pharmacy Consult for Heparin Indication: atrial fibrillation  Allergies  Allergen Reactions  . Terbinafine Hives    Patient Measurements: Height: 6' (182.9 cm) Weight: (!) 427 lb 11.1 oz (194 kg) IBW/kg (Calculated) : 77.6 Heparin Dosing Weight: 126.1 kg  Vital Signs: Temp: 98.5 F (36.9 C) (11/13 0802) Temp Source: Oral (11/13 0802) BP: 124/94 (11/13 0802) Pulse Rate: 120 (11/13 0802)  Labs: Recent Labs    06/05/19 1433 06/05/19 2123 06/06/19 0604 06/06/19 1223 06/06/19 1954 06/07/19 0215 06/07/19 0953  HGB 15.5  --   --  14.0  --  13.4  --   HCT 44.8  --   --  39.9  --  39.1  --   PLT 116*  --   --  106*  --  126*  --   APTT  --   --   --  31  --   --   --   LABPROT  --   --   --  14.5  --   --   --   INR  --   --   --  1.1  --   --   --   HEPARINUNFRC  --   --   --   --  0.57 <0.10* 0.17*  CREATININE 2.45*  --  2.72*  --   --   --   --   TROPONINIHS 10 14 15 13   --   --   --     Estimated Creatinine Clearance: 52.6 mL/min (A) (by C-G formula based on SCr of 2.72 mg/dL (H)).   Medical History: Past Medical History:  Diagnosis Date  . BPH (benign prostatic hyperplasia)   . 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 He was subsequently found to be in atrial fibrillation with RVR. Pharmacy was consulted to start heparin drip. Baseline H&H within normal limits, mild thrombocytopenia. Baseline coags within normal limits.   11/12 1954 HL 0.57 therapeutic x 1  11/13 0215 HL < 0.10, subtherapeutic - confirmed w/ RN no problems w/ infusion 11/13 0958 HL 0.17, subtherapeutic - no interruptions per RN  Hgb has dropped 2 g/dL since admission likely dilutional. Platelets are  stable. No indication of bleeding per chart.   Goal of Therapy:  Heparin level 0.3-0.7 units/ml Monitor platelets by anticoagulation protocol: Yes   Plan:  -Will rebolus w/ heparin 1500 units then increase rate to 2500 units/hr -Recheck HL in 6 hours -Daily CBC per protocol  [place] Pharmacist 06/07/2019 10:32 AM

## 2019-06-08 DIAGNOSIS — I4891 Unspecified atrial fibrillation: Secondary | ICD-10-CM | POA: Diagnosis present

## 2019-06-08 LAB — BASIC METABOLIC PANEL
Anion gap: 10 (ref 5–15)
BUN: 56 mg/dL — ABNORMAL HIGH (ref 6–20)
CO2: 17 mmol/L — ABNORMAL LOW (ref 22–32)
Calcium: 7.8 mg/dL — ABNORMAL LOW (ref 8.9–10.3)
Chloride: 106 mmol/L (ref 98–111)
Creatinine, Ser: 2.46 mg/dL — ABNORMAL HIGH (ref 0.61–1.24)
GFR calc Af Amer: 32 mL/min — ABNORMAL LOW (ref >60)
GFR calc non Af Amer: 28 mL/min — ABNORMAL LOW (ref >60)
Glucose, Bld: 111 mg/dL — ABNORMAL HIGH (ref 70–99)
Potassium: 3.7 mmol/L (ref 3.5–5.1)
Sodium: 133 mmol/L — ABNORMAL LOW (ref 135–145)

## 2019-06-08 LAB — URINE CULTURE: Culture: NO GROWTH

## 2019-06-08 NOTE — Plan of Care (Signed)
  Problem: Urinary Elimination: Goal: Signs and symptoms of infection will decrease Outcome: Progressing   Problem: Education: Goal: Knowledge of disease or condition will improve Outcome: Progressing   Problem: Education: Goal: Knowledge of General Education information will improve Description: Including pain rating scale, medication(s)/side effects and non-pharmacologic comfort measures Outcome: Progressing   Problem: Skin Integrity: Goal: Risk for impaired skin integrity will decrease Outcome: Progressing

## 2019-06-08 NOTE — Progress Notes (Signed)
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 He denies any shortness of breath or cough. He denies any chest pain, abdominal pain, diarrhea. He reports decreased urine output than expected. He does say he has a history of enlarged prostate for which he took finasteride in the past however has not been taking it for some time.   He has been taking Tylenol every 4 hours without relief. 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 He denies any history of tobacco use, alcohol use, or illicit drug use.   Assessment & Plan:   Principal Problem:   Febrile illness Active Problems:   AKI (acute kidney injury) (HCC)   Thrombocytopenia (HCC)   Community acquired pneumonia   UTI (urinary tract infection)   A-fib (HCC)   UTI with fever: Re=ordered urine culture- still pending Adjust antibiotics based on speciation with sensitivities, for now continue with current antibiotics to include azithromycin and ceftriaxone CBC white count normal  Acute kidney injury; Unclear etiology likely multifactorial in the setting of UTI dehydration We will hydrate the patient follow labs BMP pending 2.45>2.72>2.46 Follow-up renal ultrasound showed normal renal texture  New onset A. fib: Cardizem drip transitioning to p.o. Cardiology adjusting nodal blocking agents Echo Limited read secondary to A. fib, EF noted to be normal at 55 to 60% diastolic function could not be evaluated  Thrombocytopenia: Mild at 126 Recheck a CBC in the morning Evidence of bleeding  DVT prophylaxis: Heparin SQ  Code Status: full     Code Status Orders  (From admission, onward)         Start     Ordered   06/05/19 2235  Full code  Continuous     06/05/19 2235        Code Status History    This patient has a current code status but no historical code status.   Advance Care Planning Activity     Family Communication: Discussed in detail with patient Disposition Plan:   Patient remained inpatient for transition to nodal control agents by mouth, continued IV antibiotics.  Patient not stable yet for discharge Consults called: None Admission status: Inpatient   Consultants:   Cardiology  Procedures:  Dg Chest 2 View  Result Date: 06/05/2019 CLINICAL DATA:  58 year old male with shortness of breath and fever. EXAM: CHEST - 2 VIEW COMPARISON:  Chest radiograph dated 04/10/2014. FINDINGS: Mild eventration of the right hemidiaphragm similar to prior radiograph. Minimal bibasilar densities, likely atelectatic changes. No focal consolidation, pleural effusion, or pneumothorax. The cardiac silhouette is within normal limits. No acute osseous pathology. IMPRESSION: Right lung base atelectasis.  Infiltrate is less likely. Electronically Signed   By: Elgie CollardArash  [name] M.D.   On: 06/05/2019 15:01   [place] Renal  Result Date: 06/06/2019 CLINICAL DATA:  Acute renal injury EXAM: RENAL / URINARY TRACT ULTRASOUND COMPLETE COMPARISON:  None. FINDINGS: Right Kidney: Renal measurements: 13.9 x 7.9 x 6.6 cm. = volume: 377 mL . Echogenicity within normal limits. No mass or hydronephrosis visualized. Left Kidney: Renal measurements: 13.1 x 7.5 x 6.3 cm = volume: 320: ML. Echogenicity within normal limits. No mass or hydronephrosis visualized. Bladder: Appears  normal for degree of bladder distention. Other: None. IMPRESSION: Unremarkable renal ultrasound. Electronically Signed   By: Louis [name] M.D.   On: 06/06/2019 02:31     Antimicrobials:   Azithromycin and ceftriaxone day 4/5   Subjective: Patient reports he continues to improve  Not yet at baseline  Objective: Vitals:   06/08/19 0619 06/08/19 0636 06/08/19 0816 06/08/19 1106  BP: 115/83  98/73 122/85  Pulse: (!) 115 99 (!) 102 (!) 101  Resp:    18  Temp:   (!) 97.5 F (36.4 C)   TempSrc:   Oral   SpO2:   100% 99%  Weight:      Height:        Intake/Output Summary (Last 24 hours) at 06/08/2019 1547 Last data filed at 06/08/2019 0657 Gross per 24 hour  Intake 346.21 ml  Output 2300 ml  Net -1953.79 ml   Filed Weights   06/05/19 1429 06/07/19 1555  Weight: (!) 194 kg (!) 199.1 kg    Examination:  General exam: Appears calm and comfortable  Respiratory system: Clear to auscultation. Respiratory effort normal. Cardiovascular system: Irregularly irregular although rate controlled, no murmurs noted, limited exam secondary to body habitus  gastrointestinal system: Abdomen is protuberant soft and nontender. No organomegaly or masses felt. Normal bowel sounds heard. Central nervous system: Alert and oriented. No focal neurological deficits. Extremities: Warm well perfused, neurovascularly intact  skin: Multiple small skin abrasions but no rashes or purulent sites Psychiatry: Judgement and insight appear limited. Mood & affect appropriate.     Data Reviewed: I have personally reviewed following labs and imaging studies  CBC: Recent Labs  Lab 06/05/19 1433 06/06/19 1223 06/07/19 0215  WBC 12.1* 9.6 9.6  NEUTROABS 10.6* 7.8*  --   HGB 15.5 14.0 13.4  HCT 44.8 39.9 39.1  MCV 90.7 89.3 91.8  PLT 116* 106* 126*   Basic Metabolic Panel: Recent Labs  Lab 06/05/19 1433 06/06/19 0604 06/07/19 1522 06/08/19 0643  NA 132* 133* 134* 133*  K 3.8 4.0 4.0 3.7  CL 103 104 105 106  CO2 19* 18* 18* 17*  GLUCOSE 125* 118* 132* 111*  BUN 32* 37* 54* 56*  CREATININE 2.45* 2.72* 2.77* 2.46*  CALCIUM 8.8* 8.1* 8.0* 7.8*  MG  --  2.3  --   --    GFR: Estimated Creatinine Clearance: 59.1 mL/min (A) (by C-G formula based on SCr of 2.46 mg/dL (H)).  Liver Function Tests: Recent Labs  Lab 06/05/19 1433  AST 27  ALT 19  ALKPHOS 79  BILITOT 2.3*  PROT 6.9  ALBUMIN 3.1*   No results for input(s): LIPASE, AMYLASE in the last 168 hours. No results for input(s): AMMONIA in the last 168 hours. Coagulation Profile: Recent Labs  Lab 06/06/19 1223  INR 1.1   Cardiac Enzymes: No results for input(s): CKTOTAL, CKMB, CKMBINDEX, TROPONINI in the last 168 hours. BNP (last 3 results) No results for input(s): PROBNP in the last 8760 hours. HbA1C: No results for input(s): HGBA1C in the last 72 hours. CBG: No results for input(s): GLUCAP in the last 168 hours. Lipid Profile: No results for input(s): CHOL, HDL, LDLCALC, TRIG, CHOLHDL, LDLDIRECT in the last 72 hours. Thyroid Function Tests: Recent Labs    06/06/19 0604 06/06/19 1223  TSH 3.654  --   T4TOTAL  --  6.3   Anemia Panel: No results for input(s): VITAMINB12, FOLATE, FERRITIN, TIBC, IRON, RETICCTPCT in the last 72 hours. Sepsis Labs: Recent Labs  Lab 06/05/19  1655 06/06/19 0604  PROCALCITON  --  3.99  LATICACIDVEN 1.8  --     Recent Results (from the past 240 hour(s))  SARS CORONAVIRUS 2 (TAT 6-24 HRS) Nasopharyngeal Nasopharyngeal Swab     Status: None   Collection Time: 06/05/19  9:23 PM   Specimen: Nasopharyngeal Swab  Result Value Ref Range Status   SARS Coronavirus 2 NEGATIVE NEGATIVE Final    Comment: (NOTE) SARS-CoV-2 target nucleic acids are NOT DETECTED. The SARS-CoV-2 RNA is generally detectable in upper and lower respiratory specimens during the acute phase of infection. Negative results do not preclude SARS-CoV-2 infection, do not rule out co-infections with other pathogens, and should not be used as the sole basis for treatment or other patient management decisions. Negative results must be combined with clinical observations, patient history, and epidemiological information. The expected result is Negative. Fact Sheet for Patients:  SugarRoll.be Fact Sheet for Healthcare Providers: https://www.woods-mathews.com/ This test is not yet approved or cleared by the [place] FDA and  has been authorized for detection and/or diagnosis of SARS-CoV-2 by FDA under an Emergency Use Authorization (EUA). This EUA will remain  in effect (meaning this test can be used) for the duration of the COVID-19 declaration under Section 56 4(b)(1) of the Act, 21 U.S.C. section 360bbb-3(b)(1), unless the authorization is terminated or revoked sooner. Performed at [facility], [place] [address] [place], [place] [address]   Urine Culture     Status: Abnormal   Collection Time: 06/05/19  9:23 PM   Specimen: Urine, Random  Result Value Ref Range Status   Specimen Description   Final    URINE, RANDOM Performed at [facility], [address], [place], [place] [address]    Special Requests   Final    NONE Performed at [facility], [address] [place], [place] [address]    Culture MULTIPLE SPECIES PRESENT, SUGGEST RECOLLECTION (A)  Final   Report Status 06/07/2019 FINAL  Final  CULTURE, BLOOD (ROUTINE X 2) w Reflex to ID Panel     Status: None (Preliminary result)   Collection Time: 06/06/19  6:04 AM   Specimen: BLOOD  Result Value Ref Range Status   Specimen Description BLOOD BLOOD RIGHT HAND  Final   Special Requests   Final    BOTTLES DRAWN AEROBIC AND ANAEROBIC Blood Culture adequate volume   Culture   Final    NO GROWTH 2 DAYS Performed at [facility], [address], [place], [place] [address]    Report Status PENDING  Incomplete  CULTURE, BLOOD (ROUTINE X 2) w Reflex to ID Panel     Status: None (Preliminary result)   Collection Time: 06/06/19  6:11 AM   Specimen: BLOOD  Result Value Ref Range Status   Specimen Description BLOOD BLOOD LEFT HAND  Final   Special Requests   Final    BOTTLES DRAWN AEROBIC AND ANAEROBIC Blood Culture results  may not be optimal due to an excessive volume of blood received in culture bottles   Culture   Final    NO GROWTH 2 DAYS Performed at [facility], [address], [place], [place] [address]    Report Status PENDING  Incomplete  Urine Culture     Status: None   Collection Time: 06/07/19  9:54 AM   Specimen: Urine, Clean Catch  Result Value Ref Range Status   Specimen Description   Final    URINE, CLEAN CATCH Performed at [facility],  [address], [place], [place] [address]    Special Requests   Final    NONE Performed at [facility], [address] [place], [place] [address]    Culture   Final    NO GROWTH Performed at [facility], [address], [place], [place] [address]    Report Status 06/08/2019 FINAL  Final         Radiology Studies: No results found.      Scheduled Meds: . apixaban  5 mg Oral BID  . diltiazem  60 mg Oral Q6H  . metoprolol tartrate  25 mg Oral BID   Continuous Infusions: . sodium chloride Stopped (06/08/19 0217)  . azithromycin Stopped (06/08/19 0117)  . cefTRIAXone (ROCEPHIN)  IV Stopped (06/07/19 2308)  . diltiazem (CARDIZEM) infusion Stopped (06/07/19 1418)     LOS: 2 days    Time spent: 35 min    Burke Keels, MD [facility]  If 7PM-7AM, please contact night-coverage  06/08/2019, 3:47 PM

## 2019-06-08 NOTE — Plan of Care (Signed)
  Problem: Education: Goal: Knowledge of disease or condition will improve Outcome: Progressing   Problem: Cardiac: Goal: Ability to achieve and maintain adequate cardiopulmonary perfusion will improve Outcome: Progressing   Problem: Education: Goal: Knowledge of General Education information will improve Description: Including pain rating scale, medication(s)/side effects and non-pharmacologic comfort measures Outcome: Progressing   Problem: Safety: Goal: Ability to remain free from injury will improve Outcome: Progressing

## 2019-06-09 ENCOUNTER — Inpatient Hospital Stay: Payer: BC Managed Care – PPO

## 2019-06-09 DIAGNOSIS — I4891 Unspecified atrial fibrillation: Secondary | ICD-10-CM

## 2019-06-09 DIAGNOSIS — J189 Pneumonia, unspecified organism: Secondary | ICD-10-CM

## 2019-06-09 LAB — BASIC METABOLIC PANEL
Anion gap: 9 (ref 5–15)
BUN: 50 mg/dL — ABNORMAL HIGH (ref 6–20)
CO2: 18 mmol/L — ABNORMAL LOW (ref 22–32)
Calcium: 7.6 mg/dL — ABNORMAL LOW (ref 8.9–10.3)
Chloride: 104 mmol/L (ref 98–111)
Creatinine, Ser: 2.3 mg/dL — ABNORMAL HIGH (ref 0.61–1.24)
GFR calc Af Amer: 35 mL/min — ABNORMAL LOW (ref >60)
GFR calc non Af Amer: 30 mL/min — ABNORMAL LOW (ref >60)
Glucose, Bld: 111 mg/dL — ABNORMAL HIGH (ref 70–99)
Potassium: 3.6 mmol/L (ref 3.5–5.1)
Sodium: 131 mmol/L — ABNORMAL LOW (ref 135–145)

## 2019-06-09 MED ORDER — APIXABAN 5 MG PO TABS: 5.0000 mg | ORAL_TABLET | Freq: Two times a day (BID) | ORAL | 3 refills | Status: AC

## 2019-06-09 MED ORDER — DILTIAZEM HCL ER COATED BEADS 360 MG PO CP24: 360.0000 mg | ORAL_CAPSULE | Freq: Every day | ORAL | 11 refills | Status: DC

## 2019-06-09 MED ORDER — DILTIAZEM HCL ER COATED BEADS 120 MG PO CP24
240.0000 mg | ORAL_CAPSULE | Freq: Every day | ORAL | Status: DC
  Administered 2019-06-10: 240 mg via ORAL
  Filled 2019-06-09: qty 2

## 2019-06-09 MED ORDER — DILTIAZEM HCL 30 MG PO TABS
60.0000 mg | ORAL_TABLET | Freq: Four times a day (QID) | ORAL | Status: DC
  Administered 2019-06-09 – 2019-06-10 (×3): 60 mg via ORAL
  Filled 2019-06-09 (×3): qty 2

## 2019-06-09 MED ORDER — DILTIAZEM HCL ER COATED BEADS 120 MG PO CP24: 240.0000 mg | ORAL_CAPSULE | Freq: Every day | ORAL | Status: DC

## 2019-06-09 MED ORDER — CEFDINIR 300 MG PO CAPS: 300.0000 mg | ORAL_CAPSULE | Freq: Two times a day (BID) | ORAL | 0 refills | Status: AC

## 2019-06-09 MED ORDER — METOPROLOL TARTRATE 25 MG PO TABS: 25.0000 mg | ORAL_TABLET | Freq: Two times a day (BID) | ORAL | 3 refills | Status: AC

## 2019-06-09 NOTE — Discharge Summary (Signed)
Physician Discharge Summary  Louis [name] ZOX:[id number] DOB: [birth year] DOA: 06/05/2019  PCP: Patient, No Pcp Per  Admit date: 06/05/2019 Discharge date: 06/09/2019  Admitted From: Inpatient Disposition: home  Recommendations for Outpatient Follow-up:  1. 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 He denies any shortness of breath or cough. He denies any chest pain, abdominal pain, diarrhea. He reports decreased urine output than expected. He does say he has a history of enlarged prostate for which he took finasteride in the past however has not been taking it for some time.   He has been taking Tylenol every 4 hours without relief. 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 He denies any history of tobacco use, alcohol use, or illicit drug use.  Hospital course: UTI with fever: Re=ordered urine culture- no growth but this in the setting of having alreafy received abx Will complete a 7 day course of abx CBCwhite count normal 9.6 Blood cx neg AF  Acute kidney injury; Unclear etiology likely multifactorial in the setting of UTI dehydration Hydrated the patient with improvement BMP pending 2.45>2.72>2.46>2.3 Follow-up renal ultrasound showed normal renal texture  New onset A. fib: Cardizem drip transitionedto p.o.will d/c on cardizem cd360, lopressor 25 bid Eliquis for cva ppx EchoLimited read secondary to A. fib, EF noted to be normal at 55 to 60% diastolic function could not be evaluated  Thrombocytopenia: Mild at126, PCP  to monitor closely No evidence of bleeding  Discharge Diagnoses:  Principal Problem:   Febrile illness Active Problems:   AKI (acute kidney injury) (HCC)   Thrombocytopenia (HCC)   Community acquired pneumonia   UTI (urinary tract infection)   A-fib (HCC)    Discharge Instructions  Discharge Instructions    Call MD for:   Complete by: As directed    For any acute change in medical condition   Diet - low sodium heart healthy   Complete by: As directed    Increase activity slowly   Complete by: As directed      Allergies as of 06/09/2019      Reactions   Terbinafine Hives      Medication List    TAKE these medications   acetaminophen 325 MG tablet Commonly known as: TYLENOL Take 650 mg by mouth every 6 (six) hours as needed.   apixaban 5 MG Tabs tablet Commonly known as: ELIQUIS Take 1 tablet (5 mg total) by mouth 2 (two) times daily.   cefdinir 300 MG capsule Commonly known as: OMNICEF Take 1 capsule (300 mg total) by mouth 2 (two) times daily for 4 days.   diltiazem 360 MG 24 hr capsule Commonly known as: Cardizem CD Take 1 capsule (360 mg total) by mouth daily.   ibuprofen 200 MG tablet Commonly known as: ADVIL Take 200 mg by mouth every 6 (six) hours as needed.   metoprolol tartrate 25 MG tablet Commonly known as: LOPRESSOR Take 1 tablet (25 mg total) by mouth 2 (two) times daily.       Allergies  Allergen Reactions  . Terbinafine Hives    Consultations:  cardiology   Procedures/Studies: Dg Chest 2 View  Result  Date: 06/05/2019 CLINICAL DATA:  58 year old male with shortness of breath and fever. EXAM: CHEST - 2 VIEW COMPARISON:  Chest radiograph dated 04/10/2014. FINDINGS: Mild eventration of the right hemidiaphragm similar to prior radiograph. Minimal bibasilar densities, likely atelectatic changes. No focal consolidation, pleural effusion, or pneumothorax. The cardiac silhouette is within normal limits. No acute osseous pathology.  IMPRESSION: Right lung base atelectasis.  Infiltrate is less likely. Electronically Signed   By: Elgie Collard M.D.   On: 06/05/2019 15:01   US Renal  Result Date: 06/06/2019 CLINICAL DATA:  Acute renal injury EXAM: RENAL / URINARY TRACT ULTRASOUND COMPLETE COMPARISON:  None. FINDINGS: Right Kidney: Renal measurements: 13.9 x 7.9 x 6.6 cm. = volume: 377 mL . Echogenicity within normal limits. No mass or hydronephrosis visualized. Left Kidney: Renal measurements: 13.1 x 7.5 x 6.3 cm = volume: 320: ML. Echogenicity within normal limits. No mass or hydronephrosis visualized. Bladder: Appears normal for degree of bladder distention. Other: None. IMPRESSION: Unremarkable renal ultrasound. Electronically Signed   By: Alcide Clever M.D.   On: 06/06/2019 02:31       Subjective: Reports he feels significantly better Felt he was at baseline  Discharge Exam: Vitals:   06/09/19 0533 06/09/19 0829  BP: 120/82 100/89  Pulse: 88 (!) 110  Resp: 19   Temp: 97.8 F (36.6 C) 97.7 F (36.5 C)  SpO2: 99% 99%   Vitals:   06/09/19 0032 06/09/19 0529 06/09/19 0533 06/09/19 0829  BP: 110/77 120/82 120/82 100/89  Pulse: 89 (!) 56 88 (!) 110  Resp:  19 19   Temp:  97.8 F (36.6 C) 97.8 F (36.6 C) 97.7 F (36.5 C)  TempSrc:  Oral Oral Oral  SpO2:  98% 99% 99%  Weight:   (!) 200.9 kg   Height:        General: Pt is alert, awake, not in acute distress Cardiovascular: RRR, S1/S2 +, no rubs, no gallops Respiratory: CTA bilaterally, no wheezing, no rhonchi Abdominal: Soft, NT, ND, bowel sounds + Extremities: no edema, no cyanosis, hemosiderin staining on BLE    The results of significant diagnostics from this hospitalization (including imaging, microbiology, ancillary and laboratory) are listed below for reference.     Microbiology: Recent Results (from the past 240 hour(s))  SARS CORONAVIRUS 2 (TAT 6-24 HRS) Nasopharyngeal Nasopharyngeal Swab     Status: None   Collection Time: 06/05/19   9:23 PM   Specimen: Nasopharyngeal Swab  Result Value Ref Range Status   SARS Coronavirus 2 NEGATIVE NEGATIVE Final    Comment: (NOTE) SARS-CoV-2 target nucleic acids are NOT DETECTED. The SARS-CoV-2 RNA is generally detectable in upper and lower respiratory specimens during the acute phase of infection. Negative results do not preclude SARS-CoV-2 infection, do not rule out co-infections with other pathogens, and should not be used as the sole basis for treatment or other patient management decisions. Negative results must be combined with clinical observations, patient history, and epidemiological information. The expected result is Negative. Fact Sheet for Patients: HairSlick.no Fact Sheet for Healthcare Providers: quierodirigir.com This test is not yet approved or cleared by the [place] FDA and  has been authorized for detection and/or diagnosis of SARS-CoV-2 by FDA under an Emergency Use Authorization (EUA). This EUA will remain  in effect (meaning this test can be used) for the duration of the COVID-19 declaration under Section 56 4(b)(1) of the Act, 21 U.S.C. section 360bbb-3(b)(1), unless the authorization is terminated or revoked sooner. Performed at [facility],  [address] [place], [place] [address]   Urine Culture     Status: Abnormal   Collection Time: 06/05/19  9:23 PM   Specimen: Urine, Random  Result Value Ref Range Status   Specimen Description   Final    URINE, RANDOM Performed at [facility], [address] [place], [place] [address]    Special Requests   Final    NONE Performed at [facility], [address] [place], [place] [address]    Culture MULTIPLE SPECIES PRESENT, SUGGEST RECOLLECTION (A)  Final   Report Status 06/07/2019 FINAL  Final  CULTURE, BLOOD (ROUTINE X 2) w Reflex to ID Panel     Status: None (Preliminary result)   Collection Time: 06/06/19  6:04  AM   Specimen: BLOOD  Result Value Ref Range Status   Specimen Description BLOOD BLOOD RIGHT HAND  Final   Special Requests   Final    BOTTLES DRAWN AEROBIC AND ANAEROBIC Blood Culture adequate volume   Culture   Final    NO GROWTH 2 DAYS Performed at [facility], [address] [place], [place] [address]    Report Status PENDING  Incomplete  CULTURE, BLOOD (ROUTINE X 2) w Reflex to ID Panel     Status: None (Preliminary result)   Collection Time: 06/06/19  6:11 AM   Specimen: BLOOD  Result Value Ref Range Status   Specimen Description BLOOD BLOOD LEFT HAND  Final   Special Requests   Final    BOTTLES DRAWN AEROBIC AND ANAEROBIC Blood Culture results may not be optimal due to an excessive volume of blood received in culture bottles   Culture   Final    NO GROWTH 2 DAYS Performed at [facility], [address] [place], [place] [address]    Report Status PENDING  Incomplete  Urine Culture     Status: None   Collection Time: 06/07/19  9:54 AM   Specimen: Urine, Clean Catch  Result Value Ref Range Status   Specimen Description   Final    URINE, CLEAN CATCH Performed at [facility], [address] [place], [place] [address]    Special Requests   Final    NONE Performed at [facility], [address] [place], [place] [address]    Culture   Final    NO GROWTH Performed at [facility], [address] [place], [place] [address]    Report Status 06/08/2019 FINAL  Final     Labs: BNP (last 3 results) No results for input(s): BNP in the last 8760 hours. Basic Metabolic Panel: Recent Labs  Lab 06/05/19 1433 06/06/19 0604 06/07/19 1522 06/08/19 0643 06/09/19 0607  NA 132* 133* 134* 133* 131*  K 3.8 4.0 4.0 3.7 3.6  CL 103 104 105 106 104  CO2 19* 18* 18* 17* 18*  GLUCOSE 125* 118* 132* 111* 111*  BUN 32* 37* 54* 56* 50*  CREATININE 2.45* 2.72* 2.77* 2.46* 2.30*  CALCIUM 8.8* 8.1* 8.0* 7.8* 7.6*  MG  --  2.3  --    --   --    Liver Function Tests: Recent Labs  Lab 06/05/19 1433  AST 27  ALT 19  ALKPHOS 79  BILITOT 2.3*  PROT 6.9  ALBUMIN 3.1*   No results for input(s): LIPASE, AMYLASE in the last 168 hours. No results for input(s): AMMONIA in the last 168 hours. CBC: Recent Labs  Lab 06/05/19 1433 06/06/19 1223 06/07/19 0215  WBC 12.1* 9.6 9.6  NEUTROABS 10.6* 7.8*  --   HGB 15.5 14.0 13.4  HCT 44.8 39.9 39.1  MCV 90.7 89.3 91.8  PLT 116* 106* 126*   Cardiac Enzymes: No results for input(s): CKTOTAL, CKMB, CKMBINDEX, TROPONINI in the last 168 hours. BNP: Invalid input(s): POCBNP CBG: No results for input(s): GLUCAP in the last 168 hours. D-Dimer No results for input(s): DDIMER in the last 72 hours. Hgb A1c No results for input(s): HGBA1C in the last 72 hours. Lipid Profile No results for input(s): CHOL, HDL, LDLCALC, TRIG, CHOLHDL, LDLDIRECT in the last 72 hours. Thyroid function studies Recent Labs    06/06/19 1223  T4TOTAL 6.3   Anemia work up No results for input(s): VITAMINB12, FOLATE, FERRITIN, TIBC, IRON, RETICCTPCT in the last 72 hours. Urinalysis    Component Value Date/Time   COLORURINE AMBER (A) 06/05/2019 2123   APPEARANCEUR TURBID (A) 06/05/2019 2123   LABSPEC 1.016 06/05/2019 2123   PHURINE 5.0 06/05/2019 2123   GLUCOSEU NEGATIVE 06/05/2019 2123   HGBUR SMALL (A) 06/05/2019 2123   BILIRUBINUR NEGATIVE 06/05/2019 2123   KETONESUR NEGATIVE 06/05/2019 2123   PROTEINUR 100 (A) 06/05/2019 2123   NITRITE NEGATIVE 06/05/2019 2123   LEUKOCYTESUR MODERATE (A) 06/05/2019 2123   Sepsis Labs Invalid input(s): PROCALCITONIN,  WBC,  LACTICIDVEN Microbiology Recent Results (from the past 240 hour(s))  SARS CORONAVIRUS 2 (TAT 6-24 HRS) Nasopharyngeal Nasopharyngeal Swab     Status: None   Collection Time: 06/05/19  9:23 PM   Specimen: Nasopharyngeal Swab  Result Value Ref Range Status   SARS Coronavirus 2 NEGATIVE NEGATIVE Final    Comment: (NOTE) SARS-CoV-2  target nucleic acids are NOT DETECTED. The SARS-CoV-2 RNA is generally detectable in upper and lower respiratory specimens during the acute phase of infection. Negative results do not preclude SARS-CoV-2 infection, do not rule out co-infections with other pathogens, and should not be used as the sole basis for treatment or other patient management decisions. Negative results must be combined with clinical observations, patient history, and epidemiological information. The expected result is Negative. Fact Sheet for Patients: HairSlick.no Fact Sheet for Healthcare Providers: quierodirigir.com This test is not yet approved or cleared by the [place] FDA and  has been authorized for detection and/or diagnosis of SARS-CoV-2 by FDA under an Emergency Use Authorization (EUA). This EUA will remain  in effect (meaning this test can be used) for the duration of the COVID-19 declaration under Section 56 4(b)(1) of the Act, 21 U.S.C. section 360bbb-3(b)(1), unless the authorization is terminated or revoked sooner. Performed at [facility], [address] [place], [place] [address]   Urine Culture     Status: Abnormal   Collection Time: 06/05/19  9:23 PM   Specimen: Urine, Random  Result Value Ref Range Status   Specimen Description   Final    URINE, RANDOM Performed at [facility], [address] [place], [place] [address]    Special Requests   Final    NONE Performed at [facility], [address] [place], [place] [address]    Culture MULTIPLE SPECIES PRESENT, SUGGEST RECOLLECTION (A)  Final   Report Status 06/07/2019 FINAL  Final  CULTURE, BLOOD (ROUTINE X 2) w Reflex to ID Panel     Status: None (Preliminary result)   Collection Time: 06/06/19  6:04 AM   Specimen: BLOOD  Result Value Ref Range Status   Specimen Description BLOOD BLOOD RIGHT HAND  Final   Special Requests  Final    BOTTLES  DRAWN AEROBIC AND ANAEROBIC Blood Culture adequate volume   Culture   Final    NO GROWTH 2 DAYS Performed at [facility], [address] [place], [place] [address]    Report Status PENDING  Incomplete  CULTURE, BLOOD (ROUTINE X 2) w Reflex to ID Panel     Status: None (Preliminary result)   Collection Time: 06/06/19  6:11 AM   Specimen: BLOOD  Result Value Ref Range Status   Specimen Description BLOOD BLOOD LEFT HAND  Final   Special Requests   Final    BOTTLES DRAWN AEROBIC AND ANAEROBIC Blood Culture results may not be optimal due to an excessive volume of blood received in culture bottles   Culture   Final    NO GROWTH 2 DAYS Performed at [facility], [address], [place], [place] [address]    Report Status PENDING  Incomplete  Urine Culture     Status: None   Collection Time: 06/07/19  9:54 AM   Specimen: Urine, Clean Catch  Result Value Ref Range Status   Specimen Description   Final    URINE, CLEAN CATCH Performed at [facility], [address] [place], [place] [address]    Special Requests   Final    NONE Performed at [facility], [address], [place], [place] [address]    Culture   Final    NO GROWTH Performed at [facility], [place] [address] [place], [place] [address]    Report Status 06/08/2019 FINAL  Final     Time coordinating discharge: Over 30 minutes  SIGNED:   Nicolette Bang, MD  [facility] 06/09/2019, 9:49 AM Pager   If 7PM-7AM, please contact night-coverage [email or web address] Password [email or web address]

## 2019-06-09 NOTE — Progress Notes (Signed)
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 He denies any shortness of breath or cough. He denies any chest pain, abdominal pain, diarrhea. He reports decreased urine output than expected. He does say he has a history of enlarged prostate for which he took finasteride in the past however has not been taking it for some time.   He has been taking Tylenol every 4 hours without relief. 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 He denies any history of tobacco use, alcohol use, or illicit drug use.   Assessment & Plan:   Principal Problem:   Febrile illness Active Problems:   AKI (acute kidney injury) (HCC)   Thrombocytopenia (HCC)   Community acquired pneumonia   UTI (urinary tract infection)   A-fib (HCC)   UTI with fever: Re=ordered urine culture-shows no growth. In the setting of having previously received antibiotics Completed a course of antibiotics on discharge CBCwhite count normal  Acute kidney injury; Unclear etiology likely multifactorial in the setting of UTI dehydration We will hydrate the patient follow labs BMP pending 2.45>2.72>2.46>2.3 Follow-up renal ultrasound showed normal renal texture  New onset A. fib: Cardizem drip transitioning to p.o. Patient had reported breakthrough A. fib RVR overnight Cardiology adjusting nodal blocking agents EchoLimited read secondary to A. fib, EF noted to be normal at 55 to 60% diastolic function could not be evaluated  Thrombocytopenia: Mild at126 Recheck a CBC in the morning Evidence of bleeding  DVT prophylaxis: Heparin SQ   Code Status: Full code    Code Status Orders  (From admission, onward)         Start     Ordered   06/05/19 2235  Full code  Continuous     06/05/19 2235        Code Status History    This patient has a current code status but no historical code status.   Advance Care Planning Activity     Family Communication: Discussed in detail with patient Disposition Plan: Patient remained inpatient for continued adjustment of nodal blocking agents.  Patient not stable for discharge second of breakthrough RVR Consults called: None Admission status: Inpatient   Consultants:   Cardiology  Procedures:  Dg Chest 2 View  Result Date: 06/05/2019 CLINICAL DATA:  58 year old male with shortness of breath and fever. EXAM: CHEST - 2 VIEW COMPARISON:  Chest radiograph dated 04/10/2014. FINDINGS: Mild eventration of the right hemidiaphragm similar to prior radiograph. Minimal bibasilar densities, likely atelectatic changes. No focal consolidation, pleural effusion, or pneumothorax. The cardiac silhouette is within normal limits. No acute osseous pathology. IMPRESSION: Right lung base atelectasis.  Infiltrate is less likely. Electronically Signed   By: Elgie CollardArash  [name] M.D.   On: 06/05/2019 15:01   [place] Renal  Result Date: 06/06/2019 CLINICAL DATA:  Acute renal injury EXAM: RENAL / URINARY TRACT ULTRASOUND COMPLETE COMPARISON:  None. FINDINGS: Right Kidney: Renal measurements: 13.9 x 7.9 x 6.6 cm. = volume: 377 mL . Echogenicity within normal limits. No mass or hydronephrosis visualized. Left Kidney: Renal measurements: 13.1 x 7.5 x 6.3 cm = volume: 320: ML. Echogenicity within normal limits. No mass or hydronephrosis visualized. Bladder: Appears  normal for degree of bladder distention. Other: None. IMPRESSION: Unremarkable renal ultrasound. Electronically Signed   By: Louis [name] M.D.   On: 06/06/2019 02:31     Antimicrobials:   Azithromycin and ceftriaxone day 5   Subjective: Reported by  staff to have A. fib RVR overnight Not noted on vital signs  Objective: Vitals:   06/09/19 0032 06/09/19 0529 06/09/19 0533 06/09/19 0829  BP: 110/77 120/82 120/82 100/89  Pulse: 89 (!) 56 88 (!) 110  Resp:  19 19   Temp:  97.8 F (36.6 C) 97.8 F (36.6 C) 97.7 F (36.5 C)  TempSrc:  Oral Oral Oral  SpO2:  98% 99% 99%  Weight:   (!) 200.9 kg   Height:        Intake/Output Summary (Last 24 hours) at 06/09/2019 1528 Last data filed at 06/09/2019 1058 Gross per 24 hour  Intake 370.84 ml  Output 4100 ml  Net -3729.16 ml   Filed Weights   06/05/19 1429 06/07/19 1555 06/09/19 0533  Weight: (!) 194 kg (!) 199.1 kg (!) 200.9 kg    Examination:  General exam:Appears calm and comfortable  Respiratory system: Clear to auscultation. Respiratory effort normal. Cardiovascular system:Irregularly irregular although rate controlled my eval, no murmurs noted, limited exam secondary to body habitus  gastrointestinal system:Abdomen is protuberantsoft and nontender. No organomegaly or masses felt. Normal bowel sounds heard. Central nervous system:Alert and oriented. No focal neurological deficits. Extremities:Warm well perfused, neurovascularly intact  skin:Multiple small skin abrasions but no rashes or purulent sites Psychiatry:Judgement and insight appear limited. Mood &affect appropriate.      Data Reviewed: I have personally reviewed following labs and imaging studies  CBC: Recent Labs  Lab 06/05/19 1433 06/06/19 1223 06/07/19 0215  WBC 12.1* 9.6 9.6  NEUTROABS 10.6* 7.8*  --   HGB 15.5 14.0 13.4  HCT 44.8 39.9 39.1  MCV 90.7 89.3 91.8  PLT 116* 106* 841*   Basic Metabolic Panel: Recent Labs  Lab 06/05/19 1433 06/06/19 0604 06/07/19 1522 06/08/19 0643 06/09/19 0607  NA 132* 133* 134* 133* 131*  K 3.8 4.0 4.0 3.7 3.6  CL 103 104 105 106 104  CO2 19* 18* 18* 17* 18*  GLUCOSE 125* 118* 132* 111* 111*  BUN 32* 37* 54* 56* 50*  CREATININE 2.45* 2.72* 2.77*  2.46* 2.30*  CALCIUM 8.8* 8.1* 8.0* 7.8* 7.6*  MG  --  2.3  --   --   --    GFR: Estimated Creatinine Clearance: 63.6 mL/min (A) (by C-G formula based on SCr of 2.3 mg/dL (H)). Liver Function Tests: Recent Labs  Lab 06/05/19 1433  AST 27  ALT 19  ALKPHOS 79  BILITOT 2.3*  PROT 6.9  ALBUMIN 3.1*   No results for input(s): LIPASE, AMYLASE in the last 168 hours. No results for input(s): AMMONIA in the last 168 hours. Coagulation Profile: Recent Labs  Lab 06/06/19 1223  INR 1.1   Cardiac Enzymes: No results for input(s): CKTOTAL, CKMB, CKMBINDEX, TROPONINI in the last 168 hours. BNP (last 3 results) No results for input(s): PROBNP in the last 8760 hours. HbA1C: No results for input(s): HGBA1C in the last 72 hours. CBG: No results for input(s): GLUCAP in the last 168 hours. Lipid Profile: No results for input(s): CHOL, HDL, LDLCALC, TRIG, CHOLHDL, LDLDIRECT in the last 72 hours. Thyroid Function Tests: No results for input(s): TSH, T4TOTAL, FREET4, T3FREE, THYROIDAB in the last 72 hours. Anemia Panel: No results for input(s): VITAMINB12, FOLATE, FERRITIN, TIBC, IRON,  RETICCTPCT in the last 72 hours. Sepsis Labs: Recent Labs  Lab 06/05/19 1655 06/06/19 0604  PROCALCITON  --  3.99  LATICACIDVEN 1.8  --     Recent Results (from the past 240 hour(s))  SARS CORONAVIRUS 2 (TAT 6-24 HRS) Nasopharyngeal Nasopharyngeal Swab     Status: None   Collection Time: 06/05/19  9:23 PM   Specimen: Nasopharyngeal Swab  Result Value Ref Range Status   SARS Coronavirus 2 NEGATIVE NEGATIVE Final    Comment: (NOTE) SARS-CoV-2 target nucleic acids are NOT DETECTED. The SARS-CoV-2 RNA is generally detectable in upper and lower respiratory specimens during the acute phase of infection. Negative results do not preclude SARS-CoV-2 infection, do not rule out co-infections with other pathogens, and should not be used as the sole basis for treatment or other patient management decisions.  Negative results must be combined with clinical observations, patient history, and epidemiological information. The expected result is Negative. Fact Sheet for Patients: HairSlick.no Fact Sheet for Healthcare Providers: quierodirigir.com This test is not yet approved or cleared by the [place] FDA and  has been authorized for detection and/or diagnosis of SARS-CoV-2 by FDA under an Emergency Use Authorization (EUA). This EUA will remain  in effect (meaning this test can be used) for the duration of the COVID-19 declaration under Section 56 4(b)(1) of the Act, 21 U.S.C. section 360bbb-3(b)(1), unless the authorization is terminated or revoked sooner. Performed at [facility], [address], [place], [place] [address]   Urine Culture     Status: Abnormal   Collection Time: 06/05/19  9:23 PM   Specimen: Urine, Random  Result Value Ref Range Status   Specimen Description   Final    URINE, RANDOM Performed at [facility], [address], [place], [place] [address]    Special Requests   Final    NONE Performed at [facility], [address] [place], [place] [address]    Culture MULTIPLE SPECIES PRESENT, SUGGEST RECOLLECTION (A)  Final   Report Status 06/07/2019 FINAL  Final  CULTURE, BLOOD (ROUTINE X 2) w Reflex to ID Panel     Status: None (Preliminary result)   Collection Time: 06/06/19  6:04 AM   Specimen: BLOOD  Result Value Ref Range Status   Specimen Description BLOOD BLOOD RIGHT HAND  Final   Special Requests   Final    BOTTLES DRAWN AEROBIC AND ANAEROBIC Blood Culture adequate volume   Culture   Final    NO GROWTH 2 DAYS Performed at [facility], [address] [place], [place] [address]    Report Status PENDING  Incomplete  CULTURE, BLOOD (ROUTINE X 2) w Reflex to ID Panel     Status: None (Preliminary result)   Collection Time: 06/06/19  6:11 AM   Specimen: BLOOD   Result Value Ref Range Status   Specimen Description BLOOD BLOOD LEFT HAND  Final   Special Requests   Final    BOTTLES DRAWN AEROBIC AND ANAEROBIC Blood Culture results may not be optimal due to an excessive volume of blood received in culture bottles   Culture   Final    NO GROWTH 2 DAYS Performed at [facility], [address], [place], [place] [address]    Report Status PENDING  Incomplete  Urine Culture     Status: None   Collection Time: 06/07/19  9:54 AM   Specimen: Urine, Clean Catch  Result Value Ref Range Status   Specimen Description  Final    URINE, CLEAN CATCH Performed at [facility], [address] [place], [place] [address]    Special Requests   Final    NONE Performed at [facility], [address], [place], [place] [address]    Culture   Final    NO GROWTH Performed at [facility], [address], [place], [place] [address]    Report Status 06/08/2019 FINAL  Final         Radiology Studies: No results found.      Scheduled Meds: . apixaban  5 mg Oral BID  . [START ON 06/10/2019] diltiazem  240 mg Oral Daily  . diltiazem  60 mg Oral Q6H  . metoprolol tartrate  25 mg Oral BID   Continuous Infusions: . sodium chloride Stopped (06/09/19 0220)  . azithromycin Stopped (06/09/19 0136)  . cefTRIAXone (ROCEPHIN)  IV Stopped (06/08/19 2200)     LOS: 3 days    Time spent: 35 min    Burke Keels, MD [facility]  If 7PM-7AM, please contact night-coverage  06/09/2019, 3:28 PM

## 2019-06-09 NOTE — Progress Notes (Signed)
Patient Name: Louis [name] Date of Encounter: 06/09/2019  Hospital Problem List     Principal Problem:   Febrile illness Active Problems:   AKI (acute kidney injury) (HCC)   Thrombocytopenia (HCC)   Community acquired pneumonia   UTI (urinary tract infection)   A-fib [facility])    Patient Profile     58 year old male who presented to emergency room with complaints of community-acquired pneumonia, acute renal insufficiency, thrombocytopenia and atrial fibrillation with rapid ventricular response.  Rate improved with with current use of Cardizem p.o. and metoprolol p.o.  Will convert to Cardizem CD 240 mg daily and continue with metoprolol tartrate 25 twice daily.  Anticoagulated with Eliquis..  Echocardiogram showed preserved LV function although technique difficult due to body habitus.  Subjective   Feeling well.  No chest pain or shortness of breath.  Inpatient Medications    . apixaban  5 mg Oral BID  . diltiazem  240 mg Oral Daily  . metoprolol tartrate  25 mg Oral BID    Vital Signs    Vitals:   06/09/19 0032 06/09/19 0529 06/09/19 0533 06/09/19 0829  BP: 110/77 120/82 120/82 100/89  Pulse: 89 (!) 56 88 (!) 110  Resp:  19 19   Temp:  97.8 F (36.6 C) 97.8 F (36.6 C) 97.7 F (36.5 C)  TempSrc:  Oral Oral Oral  SpO2:  98% 99% 99%  Weight:   (!) 200.9 kg   Height:        Intake/Output Summary (Last 24 hours) at 06/09/2019 1246 Last data filed at 06/09/2019 1058 Gross per 24 hour  Intake 370.84 ml  Output 4100 ml  Net -3729.16 ml   Filed Weights   06/05/19 1429 06/07/19 1555 06/09/19 0533  Weight: (!) 194 kg (!) 199.1 kg (!) 200.9 kg    Physical Exam    GEN: Well nourished, well developed, in no acute distress.  HEENT: normal.  Neck: Supple, no JVD, carotid bruits, or masses. Cardiac: Irregular regular rhythm Respiratory:  Respirations regular and unlabored, clear to auscultation bilaterally. GI: Difficult to assess due to morbid obesity. MS:  no deformity or atrophy. Skin: warm and dry, no rash. Neuro:  Strength and sensation are intact. Psych: Normal affect.  Labs    CBC Recent Labs    06/07/19 0215  WBC 9.6  HGB 13.4  HCT 39.1  MCV 91.8  PLT 126*   Basic Metabolic Panel Recent Labs    81/27/51 0643 06/09/19 0607  NA 133* 131*  K 3.7 3.6  CL 106 104  CO2 17* 18*  GLUCOSE 111* 111*  BUN 56* 50*  CREATININE 2.46* 2.30*  CALCIUM 7.8* 7.6*   Liver Function Tests No results for input(s): AST, ALT, ALKPHOS, BILITOT, PROT, ALBUMIN in the last 72 hours. No results for input(s): LIPASE, AMYLASE in the last 72 hours. Cardiac Enzymes No results for input(s): CKTOTAL, CKMB, CKMBINDEX, TROPONINI in the last 72 hours. BNP No results for input(s): BNP in the last 72 hours. D-Dimer No results for input(s): DDIMER in the last 72 hours. Hemoglobin A1C No results for input(s): HGBA1C in the last 72 hours. Fasting Lipid Panel No results for input(s): CHOL, HDL, LDLCALC, TRIG, CHOLHDL, LDLDIRECT in the last 72 hours. Thyroid Function Tests No results for input(s): TSH, T4TOTAL, T3FREE, THYROIDAB in the last 72 hours.  Invalid input(s): FREET3  Telemetry    Atrial fibrillation with controlled ventricular response  ECG    A. fib with rapid ventricular response  Radiology  Dg Chest 2 View  Result Date: 06/05/2019 CLINICAL DATA:  58 year old male with shortness of breath and fever. EXAM: CHEST - 2 VIEW COMPARISON:  Chest radiograph dated 04/10/2014. FINDINGS: Mild eventration of the right hemidiaphragm similar to prior radiograph. Minimal bibasilar densities, likely atelectatic changes. No focal consolidation, pleural effusion, or pneumothorax. The cardiac silhouette is within normal limits. No acute osseous pathology. IMPRESSION: Right lung base atelectasis.  Infiltrate is less likely. Electronically Signed   By: Anner Crete M.D.   On: 06/05/2019 15:01   US Renal  Result Date: 06/06/2019 CLINICAL DATA:   Acute renal injury EXAM: RENAL / URINARY TRACT ULTRASOUND COMPLETE COMPARISON:  None. FINDINGS: Right Kidney: Renal measurements: 13.9 x 7.9 x 6.6 cm. = volume: 377 mL . Echogenicity within normal limits. No mass or hydronephrosis visualized. Left Kidney: Renal measurements: 13.1 x 7.5 x 6.3 cm = volume: 320: ML. Echogenicity within normal limits. No mass or hydronephrosis visualized. Bladder: Appears normal for degree of bladder distention. Other: None. IMPRESSION: Unremarkable renal ultrasound. Electronically Signed   By: Inez Catalina M.D.   On: 06/06/2019 02:31    Assessment & Plan    58 year old male with history of morbid obesity, new onset atrial fibrillation admitted with shortness of breath, acute renal insufficiency and evidence of possible community-acquired pneumonia.  Atrial fibrillation-rate is improved but still in A. fib.  Was on Cardizem at 15 mg/h and metoprolol tartrate 25 mg twice daily.  We will continue to control with Cardizem 60 every 6, metoprolol tartrate 25 twice daily and Eliquis 5 mg twice daily for now.  In the morning will convert to Cardizem CD 240 daily.  Will consider discharge if stable overnight.  Will evaluate for sleep apnea and proceed with further treatment of his A. fib as an outpatient.  We will see next week as an outpatient. Echo showed no significant structural abnormalities.  Ruled out for myocardial infarction with high-sensitivity troponins x3 which were unremarkable.  Magnesium 2.3.  CHA2DS2-VASc score is likely 1 secondary to hypertension.  Acute renal insufficiency.- Serum creatinine 2.72 up from 2.45 yesterday.  Value pending for today.  Will recheck this later on today.  Adjustments of Eliquis based on the results of this.  Sleep apnea-no definitive diagnosis however given patient's body habitus this will need to be worked up as an outpatient as a possible partial etiology of his atrial fibrillation   Urinary tract infection-currently being  treated  Thrombocytopenia we will closely follow.  Stopped heparin although no evidence of HIT.Joyce Gross Zemira Zehring MD 06/09/2019, 12:46 PM  Pager: [phone]

## 2019-06-10 LAB — BASIC METABOLIC PANEL
Anion gap: 10 (ref 5–15)
BUN: 47 mg/dL — ABNORMAL HIGH (ref 6–20)
CO2: 19 mmol/L — ABNORMAL LOW (ref 22–32)
Calcium: 7.8 mg/dL — ABNORMAL LOW (ref 8.9–10.3)
Chloride: 105 mmol/L (ref 98–111)
Creatinine, Ser: 2.13 mg/dL — ABNORMAL HIGH (ref 0.61–1.24)
GFR calc Af Amer: 39 mL/min — ABNORMAL LOW (ref >60)
GFR calc non Af Amer: 33 mL/min — ABNORMAL LOW (ref >60)
Glucose, Bld: 106 mg/dL — ABNORMAL HIGH (ref 70–99)
Potassium: 3.8 mmol/L (ref 3.5–5.1)
Sodium: 134 mmol/L — ABNORMAL LOW (ref 135–145)

## 2019-06-10 LAB — CBC
HCT: 37.6 % — ABNORMAL LOW (ref 39.0–52.0)
Hemoglobin: 13.4 g/dL (ref 13.0–17.0)
MCH: 31.2 pg (ref 26.0–34.0)
MCHC: 35.6 g/dL (ref 30.0–36.0)
MCV: 87.4 fL (ref 80.0–100.0)
Platelets: 230 10*3/uL (ref 150–400)
RBC: 4.3 MIL/uL (ref 4.22–5.81)
RDW: 12.8 % (ref 11.5–15.5)
WBC: 8.2 10*3/uL (ref 4.0–10.5)
nRBC: 0 % (ref 0.0–0.2)

## 2019-06-10 MED ORDER — DILTIAZEM HCL ER COATED BEADS 240 MG PO CP24: 240.0000 mg | ORAL_CAPSULE | Freq: Every day | ORAL | 0 refills | Status: AC

## 2019-06-10 NOTE — Progress Notes (Addendum)
Patient Name: Louis [name] Date of Encounter: 06/10/2019  Hospital Problem List     Principal Problem:   Febrile illness Active Problems:   AKI (acute kidney injury) ([place])   Thrombocytopenia ([place])   Community acquired pneumonia   UTI (urinary tract infection)   A-fib [facility])    Patient Profile       58 year old male who presented to emergency room with complaints of community-acquired pneumonia, acute renal insufficiency, thrombocytopenia and atrial fibrillation with rapid ventricular response. Rate improved with with current use of Cardizem p.o. and metoprolol p.o.  Will convert to Cardizem CD 240 mg daily and continue with metoprolol tartrate 25 twice daily.  Anticoagulated with Eliquis.. Echocardiogram showed preserved LV function .  Subjective   No sob.   Inpatient Medications    . apixaban  5 mg Oral BID  . diltiazem  240 mg Oral Daily  . diltiazem  60 mg Oral Q6H  . metoprolol tartrate  25 mg Oral BID    Vital Signs    Vitals:   06/10/19 0010 06/10/19 0445 06/10/19 0600 06/10/19 0818  BP: (!) 121/94 114/66  111/67  Pulse: 92 86  93  Resp:  18  19  Temp:  (!) 97.5 F (36.4 C) (P) 98.6 F (37 C) 98.4 F (36.9 C)  TempSrc:  Oral (P) Oral   SpO2:  99%  99%  Weight:  (!) 199.4 kg    Height:        Intake/Output Summary (Last 24 hours) at 06/10/2019 0851 Last data filed at 06/10/2019 1962 Gross per 24 hour  Intake 357.94 ml  Output 5575 ml  Net -5217.06 ml   Filed Weights   06/07/19 1555 06/09/19 0533 06/10/19 0445  Weight: (!) 199.1 kg (!) 200.9 kg (!) 199.4 kg    Physical Exam    GEN: Well nourished, well developed, in no acute distress.  HEENT: normal.  Neck: Supple, no JVD, carotid bruits, or masses. Cardiac: irr, irr Respiratory:  Respirations regular and unlabored, clear to auscultation bilaterally. GI: Soft, nontender, nondistended, BS + x 4. MS: no deformity or atrophy. Skin: warm and dry, no rash. Neuro:  Strength and sensation  are intact. Psych: Normal affect.  Labs    CBC Recent Labs    06/10/19 0413  WBC 8.2  HGB 13.4  HCT 37.6*  MCV 87.4  PLT 229   Basic Metabolic Panel Recent Labs    06/09/19 0607 06/10/19 0413  NA 131* 134*  K 3.6 3.8  CL 104 105  CO2 18* 19*  GLUCOSE 111* 106*  BUN 50* 47*  CREATININE 2.30* 2.13*  CALCIUM 7.6* 7.8*   Liver Function Tests No results for input(s): AST, ALT, ALKPHOS, BILITOT, PROT, ALBUMIN in the last 72 hours. No results for input(s): LIPASE, AMYLASE in the last 72 hours. Cardiac Enzymes No results for input(s): CKTOTAL, CKMB, CKMBINDEX, TROPONINI in the last 72 hours. BNP No results for input(s): BNP in the last 72 hours. D-Dimer No results for input(s): DDIMER in the last 72 hours. Hemoglobin A1C No results for input(s): HGBA1C in the last 72 hours. Fasting Lipid Panel No results for input(s): CHOL, HDL, LDLCALC, TRIG, CHOLHDL, LDLDIRECT in the last 72 hours. Thyroid Function Tests No results for input(s): TSH, T4TOTAL, T3FREE, THYROIDAB in the last 72 hours.  Invalid input(s): FREET3  Telemetry    afib with controlled vr  ECG    afib with rvr initially  Radiology    Dg Chest 2 View  Result Date: 06/05/2019 CLINICAL DATA:  58 year old male with shortness of breath and fever. EXAM: CHEST - 2 VIEW COMPARISON:  Chest radiograph dated 04/10/2014. FINDINGS: Mild eventration of the right hemidiaphragm similar to prior radiograph. Minimal bibasilar densities, likely atelectatic changes. No focal consolidation, pleural effusion, or pneumothorax. The cardiac silhouette is within normal limits. No acute osseous pathology. IMPRESSION: Right lung base atelectasis.  Infiltrate is less likely. Electronically Signed   By: Elgie Collard M.D.   On: 06/05/2019 15:01   US Renal  Result Date: 06/06/2019 CLINICAL DATA:  Acute renal injury EXAM: RENAL / URINARY TRACT ULTRASOUND COMPLETE COMPARISON:  None. FINDINGS: Right Kidney: Renal measurements: 13.9  x 7.9 x 6.6 cm. = volume: 377 mL . Echogenicity within normal limits. No mass or hydronephrosis visualized. Left Kidney: Renal measurements: 13.1 x 7.5 x 6.3 cm = volume: 320: ML. Echogenicity within normal limits. No mass or hydronephrosis visualized. Bladder: Appears normal for degree of bladder distention. Other: None. IMPRESSION: Unremarkable renal ultrasound. Electronically Signed   By: Alcide Clever M.D.   On: 06/06/2019 02:31   Dg Chest Port 1 View  Result Date: 06/09/2019 CLINICAL DATA:  Shortness of breath EXAM: PORTABLE CHEST 1 VIEW COMPARISON:  06/05/2019 FINDINGS: Elevation of the right hemidiaphragm. Heart is borderline in size. No confluent opacities, effusions or edema. No acute bony abnormality. IMPRESSION: Stable elevation of the right hemidiaphragm.  No active disease. Electronically Signed   By: Charlett Nose M.D.   On: 06/09/2019 19:41    Assessment & Plan    58 year old male with history of morbid obesity, new onset atrial fibrillation admitted with shortness of breath, acute renal insufficiency and evidence of possible community-acquired pneumonia.  1. Atrial fibrillation-rate is improved but still in A. fib. Was on Cardizem at 15 mg/h and metoprolol tartrate 25 mg twice daily.  We will continue to control with Cardizem 60 every 6, metoprolol tartrate 25 twice daily and Eliquis 5 mg twice daily for now.  In the morning will convert to Cardizem CD 240 daily.  .  Will evaluate for sleep apnea and proceed with further treatment of his A. fib as an outpatient.  We will see next week as an outpatient.Echo showed no significant structural abnormalities. Ruled out for myocardial infarction with high-sensitivity troponins x3 which were unremarkable. CHA2DS2-VASc score is 1 for  to hypertension. OK for discharge home today. I will arrange follow up in our office later this week.   2. Acute renal insufficiency.- Serum creatinine 2.13 down from 2.30 yesterday. Continue with Eliquis at  5 mg twice daily.   3. Sleep apnea-no definitive diagnosis however given patient's body habitus this will need to be worked up as an outpatient as a possible partial etiology of his atrial fibrillation   4. Urinary tract infection-currently being treated  5. Thrombocytopenia- stable. .  6. SOB- cxr yesterday showed a stable right diaphragm elevation. No active disease.   Signed, Darlin Priestly Lakota Schweppe MD 06/10/2019, 8:51 AM  Pager: [phone]

## 2019-06-10 NOTE — Progress Notes (Signed)
SATURATION QUALIFICATIONS: (This note is used to comply with regulatory documentation for home oxygen)  Patient Saturations on Room Air at Rest = 96%  Patient Saturations on Room Air while Ambulating = 95%  

## 2019-06-10 NOTE — Plan of Care (Signed)
  Problem: Urinary Elimination: Goal: Signs and symptoms of infection will decrease Outcome: Progressing   Problem: Education: Goal: Knowledge of disease or condition will improve Outcome: Progressing   Problem: Clinical Measurements: Goal: Diagnostic test results will improve Outcome: Progressing   Problem: Safety: Goal: Ability to remain free from injury will improve Outcome: Progressing

## 2019-06-10 NOTE — Discharge Summary (Signed)
Physician Discharge Summary  LEONARDO MAKRIS UEA:[id number] DOB: Aug 30, 1960 DOA: 06/05/2019  PCP: Patient, No Pcp Per  Admit date: 06/05/2019 Discharge date: 06/10/2019  Admitted From: Inpatient Disposition: home  Recommendations for Outpatient Follow-up:  1. 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 He denies any shortness of breath or cough. He denies any chest pain, abdominal pain, diarrhea. He reports decreased urine output than expected. He does say he has a history of enlarged prostate for which he took finasteride in the past however has not been taking it for some time.   He has been taking Tylenol every 4 hours without relief. 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 He denies any history of tobacco use, alcohol use, or illicit drug use.  Hospital course: UTI with fever: Re=ordered urine culture- no growth but this in the setting of having alreafy received abx Will complete a 7 day course of abx CBCwhite count normal 9.6 Blood cx neg AF  Acute kidney injury; Unclear etiology likely multifactorial in the setting of UTI dehydration Hydrated the patient with improvement BMP pending 2.45>2.72>2.46>2.3>2.1 Follow-up renal ultrasound showed normal renal texture  New onset A. fib: Cardizem drip transitionedto p.o.will d/c on cardizem cd240, lopressor 25 bid Eliquis for cva ppx EchoLimited read secondary to A. fib, EF noted to be normal at 55 to 60% diastolic function could not be  evaluated  Thrombocytopenia: Mild at126, PCP to monitor closely No evidence of bleeding  Discharge Diagnoses:  Principal Problem:   Febrile illness Active Problems:   AKI (acute kidney injury) (HCC)   Thrombocytopenia (HCC)   Community acquired pneumonia   UTI (urinary tract infection)   A-fib (HCC)    Discharge Instructions  Discharge Instructions    Call MD for:   Complete by: As directed    For any acute change in medical condition   Call MD for:   Complete by: As directed    For any acute change in medical condition   Diet - low sodium heart healthy   Complete by: As directed    Diet - low sodium heart healthy   Complete by: As directed    Increase activity slowly   Complete by: As directed    Increase activity slowly   Complete by: As directed      Allergies as of 06/10/2019      Reactions   Terbinafine Hives      Medication List    TAKE these medications   acetaminophen 325 MG tablet Commonly known as: TYLENOL Take 650 mg by mouth every 6 (six) hours as needed.   apixaban 5 MG Tabs tablet Commonly known as: ELIQUIS Take 1 tablet (5 mg total) by mouth 2 (two) times daily.   cefdinir 300 MG capsule Commonly known as: OMNICEF Take 1 capsule (300 mg total) by mouth 2 (two) times daily for 4 days.   diltiazem 240 MG 24 hr capsule Commonly known as: CARDIZEM CD Take 1 capsule (240 mg total) by mouth daily. Start taking on: June 11, 2019   ibuprofen 200 MG tablet Commonly known as: ADVIL Take 200 mg by mouth every 6 (  six) hours as needed.   metoprolol tartrate 25 MG tablet Commonly known as: LOPRESSOR Take 1 tablet (25 mg total) by mouth 2 (two) times daily.      Follow-up Information    [facility] Follow up.   Why: Please call and make new patient appointment Contact information: [address] [place] [address] [phone]        Dalia Heading, MD Follow up in 1 week(s).   Specialty: Cardiology Contact  information: [address] [place] [address] [phone]          Allergies  Allergen Reactions  . Terbinafine Hives    Consultations:  cardiology   Procedures/Studies: Dg Chest 2 View  Result Date: 06/05/2019 CLINICAL DATA:  58 year old male with shortness of breath and fever. EXAM: CHEST - 2 VIEW COMPARISON:  Chest radiograph dated 04/10/2014. FINDINGS: Mild eventration of the right hemidiaphragm similar to prior radiograph. Minimal bibasilar densities, likely atelectatic changes. No focal consolidation, pleural effusion, or pneumothorax. The cardiac silhouette is within normal limits. No acute osseous pathology. IMPRESSION: Right lung base atelectasis.  Infiltrate is less likely. Electronically Signed   By: Elgie Collard M.D.   On: 06/05/2019 15:01   US Renal  Result Date: 06/06/2019 CLINICAL DATA:  Acute renal injury EXAM: RENAL / URINARY TRACT ULTRASOUND COMPLETE COMPARISON:  None. FINDINGS: Right Kidney: Renal measurements: 13.9 x 7.9 x 6.6 cm. = volume: 377 mL . Echogenicity within normal limits. No mass or hydronephrosis visualized. Left Kidney: Renal measurements: 13.1 x 7.5 x 6.3 cm = volume: 320: ML. Echogenicity within normal limits. No mass or hydronephrosis visualized. Bladder: Appears normal for degree of bladder distention. Other: None. IMPRESSION: Unremarkable renal ultrasound. Electronically Signed   By: Alcide Clever M.D.   On: 06/06/2019 02:31   Dg Chest Port 1 View  Result Date: 06/09/2019 CLINICAL DATA:  Shortness of breath EXAM: PORTABLE CHEST 1 VIEW COMPARISON:  06/05/2019 FINDINGS: Elevation of the right hemidiaphragm. Heart is borderline in size. No confluent opacities, effusions or edema. No acute bony abnormality. IMPRESSION: Stable elevation of the right hemidiaphragm.  No active disease. Electronically Signed   By: Charlett Nose M.D.   On: 06/09/2019 19:41       Subjective: Stable, hr controlled Exertional sats on RA  95%  Discharge Exam: Vitals:   06/10/19 1415 06/10/19 1424  BP:    Pulse:    Resp:    Temp:    SpO2: 96% 95%   Vitals:   06/10/19 1314 06/10/19 1400 06/10/19 1415 06/10/19 1424  BP:      Pulse:      Resp:      Temp:      TempSrc:      SpO2: 98% 97% 96% 95%  Weight:      Height:        General: Pt is alert, awake, not in acute distress Cardiovascular: RRR, S1/S2 +, no rubs, no gallops Respiratory: CTA bilaterally, no wheezing, no rhonchi Abdominal: Soft, NT, ND, bowel sounds + Extremities: no edema, no cyanosis, Hemosiderin staining    The results of significant diagnostics from this hospitalization (including imaging, microbiology, ancillary and laboratory) are listed below for reference.     Microbiology: Recent Results (from the past 240 hour(s))  SARS CORONAVIRUS 2 (TAT 6-24 HRS) Nasopharyngeal Nasopharyngeal Swab     Status: None   Collection Time: 06/05/19  9:23 PM   Specimen: Nasopharyngeal Swab  Result Value Ref Range Status   SARS Coronavirus  2 NEGATIVE NEGATIVE Final    Comment: (NOTE) SARS-CoV-2 target nucleic acids are NOT DETECTED. The SARS-CoV-2 RNA is generally detectable in upper and lower respiratory specimens during the acute phase of infection. Negative results do not preclude SARS-CoV-2 infection, do not rule out co-infections with other pathogens, and should not be used as the sole basis for treatment or other patient management decisions. Negative results must be combined with clinical observations, patient history, and epidemiological information. The expected result is Negative. Fact Sheet for Patients: SugarRoll.be Fact Sheet for Healthcare Providers: https://www.woods-mathews.com/ This test is not yet approved or cleared by the [place] FDA and  has been authorized for detection and/or diagnosis of SARS-CoV-2 by FDA under an Emergency Use Authorization (EUA). This EUA will remain  in effect  (meaning this test can be used) for the duration of the COVID-19 declaration under Section 56 4(b)(1) of the Act, 21 U.S.C. section 360bbb-3(b)(1), unless the authorization is terminated or revoked sooner. Performed at [facility], [place] [address], [place], [place] [address]   Urine Culture     Status: Abnormal   Collection Time: 06/05/19  9:23 PM   Specimen: Urine, Random  Result Value Ref Range Status   Specimen Description   Final    URINE, RANDOM Performed at [facility], [address], [place], [place] [address]    Special Requests   Final    NONE Performed at [facility], [address] [place], [place] [address]    Culture MULTIPLE SPECIES PRESENT, SUGGEST RECOLLECTION (A)  Final   Report Status 06/07/2019 FINAL  Final  CULTURE, BLOOD (ROUTINE X 2) w Reflex to ID Panel     Status: None (Preliminary result)   Collection Time: 06/06/19  6:04 AM   Specimen: BLOOD  Result Value Ref Range Status   Specimen Description BLOOD BLOOD RIGHT HAND  Final   Special Requests   Final    BOTTLES DRAWN AEROBIC AND ANAEROBIC Blood Culture adequate volume   Culture   Final    NO GROWTH 4 DAYS Performed at [facility], [address] [place], [place] [address]    Report Status PENDING  Incomplete  CULTURE, BLOOD (ROUTINE X 2) w Reflex to ID Panel     Status: None (Preliminary result)   Collection Time: 06/06/19  6:11 AM   Specimen: BLOOD  Result Value Ref Range Status   Specimen Description BLOOD BLOOD LEFT HAND  Final   Special Requests   Final    BOTTLES DRAWN AEROBIC AND ANAEROBIC Blood Culture results may not be optimal due to an excessive volume of blood received in culture bottles   Culture   Final    NO GROWTH 4 DAYS Performed at [facility], [address], [place], [place] [address]    Report Status PENDING  Incomplete  Urine Culture     Status: None   Collection Time: 06/07/19  9:54 AM   Specimen: Urine, Clean Catch   Result Value Ref Range Status   Specimen Description   Final    URINE, CLEAN CATCH Performed at [facility], [address] [place], [place] [address]    Special Requests   Final    NONE Performed at [facility], [address] [place], [place] [address]    Culture   Final    NO GROWTH Performed at [facility], [place] [address], [place], [place] [address]    Report Status 06/08/2019 FINAL  Final     Labs: BNP (last 3 results) No results for input(s): BNP in the last 8760 hours. Basic Metabolic Panel: Recent Labs  Lab 06/06/19 0604 06/07/19 1522 06/08/19 0643 06/09/19 0607 06/10/19 0413  NA 133* 134* 133* 131* 134*  K 4.0 4.0 3.7 3.6 3.8  CL 104 105 106 104 105  CO2 18* 18* 17* 18* 19*  GLUCOSE 118* 132* 111* 111* 106*  BUN 37* 54* 56* 50* 47*  CREATININE 2.72* 2.77* 2.46* 2.30* 2.13*  CALCIUM 8.1* 8.0* 7.8* 7.6* 7.8*  MG 2.3  --   --   --   --    Liver Function Tests: Recent Labs  Lab 06/05/19 1433  AST 27  ALT 19  ALKPHOS 79  BILITOT 2.3*  PROT 6.9  ALBUMIN 3.1*   No results for input(s): LIPASE, AMYLASE in the last 168 hours. No results for input(s): AMMONIA in the last 168 hours. CBC: Recent Labs  Lab 06/05/19 1433 06/06/19 1223 06/07/19 0215 06/10/19 0413  WBC 12.1* 9.6 9.6 8.2  NEUTROABS 10.6* 7.8*  --   --   HGB 15.5 14.0 13.4 13.4  HCT 44.8 39.9 39.1 37.6*  MCV 90.7 89.3 91.8 87.4  PLT 116* 106* 126* 230   Cardiac Enzymes: No results for input(s): CKTOTAL, CKMB, CKMBINDEX, TROPONINI in the last 168 hours. BNP: Invalid input(s): POCBNP CBG: No results for input(s): GLUCAP in the last 168 hours. D-Dimer No results for input(s): DDIMER in the last 72 hours. Hgb A1c No results for input(s): HGBA1C in the last 72 hours. Lipid Profile No results for input(s): CHOL, HDL, LDLCALC, TRIG, CHOLHDL, LDLDIRECT in the last 72 hours. Thyroid function studies No results for input(s): TSH, T4TOTAL, T3FREE, THYROIDAB in  the last 72 hours.  Invalid input(s): FREET3 Anemia work up No results for input(s): VITAMINB12, FOLATE, FERRITIN, TIBC, IRON, RETICCTPCT in the last 72 hours. Urinalysis    Component Value Date/Time   COLORURINE AMBER (A) 06/05/2019 2123   APPEARANCEUR TURBID (A) 06/05/2019 2123   LABSPEC 1.016 06/05/2019 2123   PHURINE 5.0 06/05/2019 2123   GLUCOSEU NEGATIVE 06/05/2019 2123   HGBUR SMALL (A) 06/05/2019 2123   BILIRUBINUR NEGATIVE 06/05/2019 2123   KETONESUR NEGATIVE 06/05/2019 2123   PROTEINUR 100 (A) 06/05/2019 2123   NITRITE NEGATIVE 06/05/2019 2123   LEUKOCYTESUR MODERATE (A) 06/05/2019 2123   Sepsis Labs Invalid input(s): PROCALCITONIN,  WBC,  LACTICIDVEN Microbiology Recent Results (from the past 240 hour(s))  SARS CORONAVIRUS 2 (TAT 6-24 HRS) Nasopharyngeal Nasopharyngeal Swab     Status: None   Collection Time: 06/05/19  9:23 PM   Specimen: Nasopharyngeal Swab  Result Value Ref Range Status   SARS Coronavirus 2 NEGATIVE NEGATIVE Final    Comment: (NOTE) SARS-CoV-2 target nucleic acids are NOT DETECTED. The SARS-CoV-2 RNA is generally detectable in upper and lower respiratory specimens during the acute phase of infection. Negative results do not preclude SARS-CoV-2 infection, do not rule out co-infections with other pathogens, and should not be used as the sole basis for treatment or other patient management decisions. Negative results must be combined with clinical observations, patient history, and epidemiological information. The expected result is Negative. Fact Sheet for Patients: HairSlick.no Fact Sheet for Healthcare Providers: quierodirigir.com This test is not yet approved or cleared by the [place] FDA and  has been authorized for detection and/or diagnosis of SARS-CoV-2 by FDA under an Emergency Use Authorization (EUA). This EUA will remain  in effect (meaning this test can be used) for  the  duration of the COVID-19 declaration under Section 56 4(b)(1) of the Act, 21 U.S.C. section 360bbb-3(b)(1), unless the authorization is terminated or revoked sooner. Performed at [facility], [address] [place], [place] [address]   Urine Culture     Status: Abnormal   Collection Time: 06/05/19  9:23 PM   Specimen: Urine, Random  Result Value Ref Range Status   Specimen Description   Final    URINE, RANDOM Performed at [facility], [address] [place], [place] [address]    Special Requests   Final    NONE Performed at [facility], [address] [place], [place] [address]    Culture MULTIPLE SPECIES PRESENT, SUGGEST RECOLLECTION (A)  Final   Report Status 06/07/2019 FINAL  Final  CULTURE, BLOOD (ROUTINE X 2) w Reflex to ID Panel     Status: None (Preliminary result)   Collection Time: 06/06/19  6:04 AM   Specimen: BLOOD  Result Value Ref Range Status   Specimen Description BLOOD BLOOD RIGHT HAND  Final   Special Requests   Final    BOTTLES DRAWN AEROBIC AND ANAEROBIC Blood Culture adequate volume   Culture   Final    NO GROWTH 4 DAYS Performed at [facility], [address] [place], [place] [address]    Report Status PENDING  Incomplete  CULTURE, BLOOD (ROUTINE X 2) w Reflex to ID Panel     Status: None (Preliminary result)   Collection Time: 06/06/19  6:11 AM   Specimen: BLOOD  Result Value Ref Range Status   Specimen Description BLOOD BLOOD LEFT HAND  Final   Special Requests   Final    BOTTLES DRAWN AEROBIC AND ANAEROBIC Blood Culture results may not be optimal due to an excessive volume of blood received in culture bottles   Culture   Final    NO GROWTH 4 DAYS Performed at [facility], [address] [place], [place] [address]    Report Status PENDING  Incomplete  Urine Culture     Status: None   Collection Time: 06/07/19  9:54 AM   Specimen: Urine, Clean Catch  Result Value Ref Range Status   Specimen  Description   Final    URINE, CLEAN CATCH Performed at [facility], [address] [place], [place] [address]    Special Requests   Final    NONE Performed at [facility], [address] [place], [place] [address]    Culture   Final    NO GROWTH Performed at [facility], [address] [place], [place] [address]    Report Status 06/08/2019 FINAL  Final     Time coordinating discharge: Over 30 minutes  SIGNED:   Burke Keelshristopher Lenwood Balsam, MD  [facility] 06/10/2019, 2:43 PM Pager   If 7PM-7AM, please contact night-coverage [email or web address] Password [email or web address]

## 2019-06-10 NOTE — TOC Transition Note (Signed)
Transition of Care [facility]) - CM/SW Discharge Note   Patient Details  Name: Louis [name] MRN: [id number] Date of Birth: 1961-07-01  [facility] CM/SW Contact:  Ross Ludwig, LCSW Phone Number: 06/10/2019, 3:33 PM   Clinical Narrative:     Patient is on Eliquis as a new medication.  CSW was informed by bedside nurse that he will need Eliquis coupon.  CSW provided patient with Eliquis coupon.  Patient did not express any other needs or concerns.   Final next level of care: Home/Self Care Barriers to Discharge: Barriers Resolved   Patient Goals and CMS Choice Patient states their goals for this hospitalization and ongoing recovery are:: To return back home. CMS Medicare.gov Compare Post Acute Care list provided to:: Patient Choice offered to / list presented to : Patient  Discharge Placement  Patient is discharging back home, patient does not need any home health.                Discharge Plan and Services                DME Arranged: N/A DME Agency: NA       HH Arranged: NA          Social Determinants of Health (SDOH) Interventions     Readmission Risk Interventions No flowsheet data found.

## 2019-06-11 LAB — CULTURE, BLOOD (ROUTINE X 2)
Culture: NO GROWTH
Culture: NO GROWTH
Special Requests: ADEQUATE

## 2019-06-25 ENCOUNTER — Ambulatory Visit: Payer: Self-pay | Admitting: Urology

## 2020-10-07 ENCOUNTER — Encounter: Payer: Self-pay | Admitting: Urology

## 2020-10-07 ENCOUNTER — Other Ambulatory Visit: Payer: Self-pay

## 2020-10-07 ENCOUNTER — Ambulatory Visit (INDEPENDENT_AMBULATORY_CARE_PROVIDER_SITE_OTHER): Payer: BC Managed Care – PPO | Admitting: Urology

## 2020-10-07 VITALS — BP 135/64 | HR 55 | Ht 72.0 in | Wt >= 6400 oz

## 2020-10-07 DIAGNOSIS — R8281 Pyuria: Secondary | ICD-10-CM | POA: Diagnosis not present

## 2020-10-07 DIAGNOSIS — Z87440 Personal history of urinary (tract) infections: Secondary | ICD-10-CM | POA: Diagnosis not present

## 2020-10-07 LAB — URINALYSIS, COMPLETE
Bilirubin, UA: NEGATIVE
Glucose, UA: NEGATIVE
Ketones, UA: NEGATIVE
Nitrite, UA: NEGATIVE
Protein,UA: NEGATIVE
Specific Gravity, UA: 1.015 (ref 1.005–1.030)
Urobilinogen, Ur: 0.2 mg/dL (ref 0.2–1.0)
pH, UA: 7 (ref 5.0–7.5)

## 2020-10-07 LAB — MICROSCOPIC EXAMINATION: WBC, UA: >30 /hpf — AB (ref 0–5)

## 2020-10-07 MED ORDER — CEPHALEXIN 500 MG PO CAPS: 500.0000 mg | ORAL_CAPSULE | Freq: Two times a day (BID) | ORAL | 0 refills | Status: DC

## 2020-10-07 NOTE — Progress Notes (Signed)
10/07/20 1:27 PM   Louis [name] 1961/02/01 [id number]  CC: UTI/abnormal urinalysis, BPH  HPI: I saw Louis [name] in urology clinic for the above issues.  He is a 60 year old male with morbid obesity and a BMI of 61 as well as a history of BPH on Flomax who was referred over for abnormal urinalysis.  UA on 08/18/2020 showed large leuk esterase, greater than 50 WBCs, 0-3 RBCs, few bacteria, rare squamous cells, and was nitrite negative.  This was not sent for culture.  PSA was stable and normal at 0.03 at that time.  He had another follow-up with his PCP on 09/21/2020 that again showed large leukocyte esterase, 10-50 WBCs, 0-3 RBCs, and many bacteria with no squamous cells, and this culture was sent and grew >100k Streptococcus mitis.  He was started on Cipro by his PCP.  He denies any urinary symptoms at all during this time, specifically any dysuria, pelvic pain, urgency/frequency, or fevers.  He denies any gross hematuria.  He is a never smoker.  Interestingly, he reportedly has a history of hospitalization in November 2020 for fevers that were thought to be secondary to UTI, however urine culture at that time showed mixed species and no growth, and blood cultures were negative.  His urinary symptoms are well controlled on Flomax and he denies any incontinence or other urinary complaints.  Urinalysis today continues to be concerning with greater than 30 WBCs, 0-2 RBCs, moderate bacteria, no yeast, nitrite negative, 3+ leukocytes.  PVR is normal at 0 mL.  Renal function is normal with creatinine 1.0 in January 2022.   PMH: Past Medical History:  Diagnosis Date  . Atrial fibrillation (HCC)   . BPH (benign prostatic hyperplasia)   . Chronic venous stasis dermatitis   . CKD (chronic kidney disease) stage 4, GFR 15-29 ml/min (HCC)   . Mild obstructive sleep apnea   . Osteoarthritis   . Pernicious anemia     Surgical History: Past Surgical History:  Procedure Laterality Date  . KNEE  SURGERY Right   . TOE SURGERY Right     Family History: Family History  Problem Relation Age of Onset  . Heart disease Father   . Renal cancer Father   . Breast cancer Mother     Social History:  reports that he has never smoked. He has never used smokeless tobacco. He reports that he does not drink alcohol and does not use drugs.  Physical Exam: BP 135/64 (BP Location: Left Arm, Patient Position: Sitting, Cuff Size: Large)   Pulse (!) 55   Ht 6' (1.829 m)   Wt (!) 449 lb (203.7 kg)   BMI 60.90 kg/m    Constitutional: Alert, morbidly obese Cardiovascular: No clubbing, cyanosis, or edema. Respiratory: Normal respiratory effort, no increased work of breathing. 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  He does not have any UTI or urinary symptoms, and PVR is normal.  No cross-sectional  imaging to review, however renal ultrasound from November 2020 was completely benign.  I recommended a trial of Keflex for the bacteria in his urine and repeat a urinalysis in 1 month.  If he has persistent bacteriuria, it may be worth ordering a CT for further evaluation, especially in the setting of his possible sepsis from urinary source in 2020.  We also discussed weight loss, as this likely will impact his health more than any other intervention at this point.  Keflex 500  mg twice daily x14 days Urine sent for culture and Ureaplasma Follow-up in 1 month for repeat UA, consider CT urogram if recurrent bacteriuria   Legrand Rams, MD 10/07/2020  [facility] [address], [address] [place], [place] [address] [phone]

## 2020-10-12 LAB — CULTURE, URINE COMPREHENSIVE

## 2020-10-13 LAB — MYCOPLASMA / UREAPLASMA CULTURE
Mycoplasma hominis Culture: NEGATIVE
Ureaplasma urealyticum: NEGATIVE

## 2020-10-14 ENCOUNTER — Other Ambulatory Visit: Payer: Self-pay | Admitting: Urology

## 2020-10-14 ENCOUNTER — Telehealth: Payer: Self-pay

## 2020-10-14 DIAGNOSIS — A491 Streptococcal infection, unspecified site: Secondary | ICD-10-CM

## 2020-10-14 MED ORDER — CLINDAMYCIN HCL 300 MG PO CAPS: 300.0000 mg | ORAL_CAPSULE | Freq: Two times a day (BID) | ORAL | 0 refills | Status: AC

## 2020-10-14 NOTE — Telephone Encounter (Signed)
Called pt left detailed message informing pt of the information below. RX sent in. Pt advised to call back for questions or concerns.

## 2020-10-14 NOTE — Telephone Encounter (Signed)
-----   Message from Sondra Come, MD sent at 10/14/2020  9:59 AM EDT ----- Urine culture grew Streptococcus, lets change him to clindamycin 300 mg twice daily x7 days, thanks  Legrand Rams, MD 10/14/2020

## 2020-11-09 ENCOUNTER — Other Ambulatory Visit: Payer: Self-pay

## 2020-11-09 ENCOUNTER — Other Ambulatory Visit: Payer: BC Managed Care – PPO

## 2020-11-09 DIAGNOSIS — Z87440 Personal history of urinary (tract) infections: Secondary | ICD-10-CM

## 2020-11-09 LAB — MICROSCOPIC EXAMINATION
Bacteria, UA: NONE SEEN
RBC, Urine: NONE SEEN /hpf (ref 0–2)
WBC, UA: NONE SEEN /hpf (ref 0–5)

## 2020-11-09 LAB — URINALYSIS, COMPLETE
Bilirubin, UA: NEGATIVE
Glucose, UA: NEGATIVE
Ketones, UA: NEGATIVE
Leukocytes,UA: NEGATIVE
Nitrite, UA: NEGATIVE
Protein,UA: NEGATIVE
RBC, UA: NEGATIVE
Specific Gravity, UA: 1.01 (ref 1.005–1.030)
Urobilinogen, Ur: 0.2 mg/dL (ref 0.2–1.0)
pH, UA: 6.5 (ref 5.0–7.5)

## 2020-11-10 ENCOUNTER — Telehealth: Payer: Self-pay

## 2020-11-10 NOTE — Telephone Encounter (Signed)
Great catch oki! I was looking at last months result. Thank you!!!! He can follow-up in 1 year with PVR, thanks so much!

## 2020-12-29 IMAGING — CR DG CHEST 2V
1 series · 2 of 2 positions shown · non-contrast
Comparison: Chest radiograph dated 04/10/2014.

CLINICAL DATA: 58-year-old male with shortness of breath and fever.

EXAM:
CHEST - 2 VIEW

[Series 1: dg chest 2 view · 0.14mm/px · 2 of 2 slices shown]
[im 1/2]
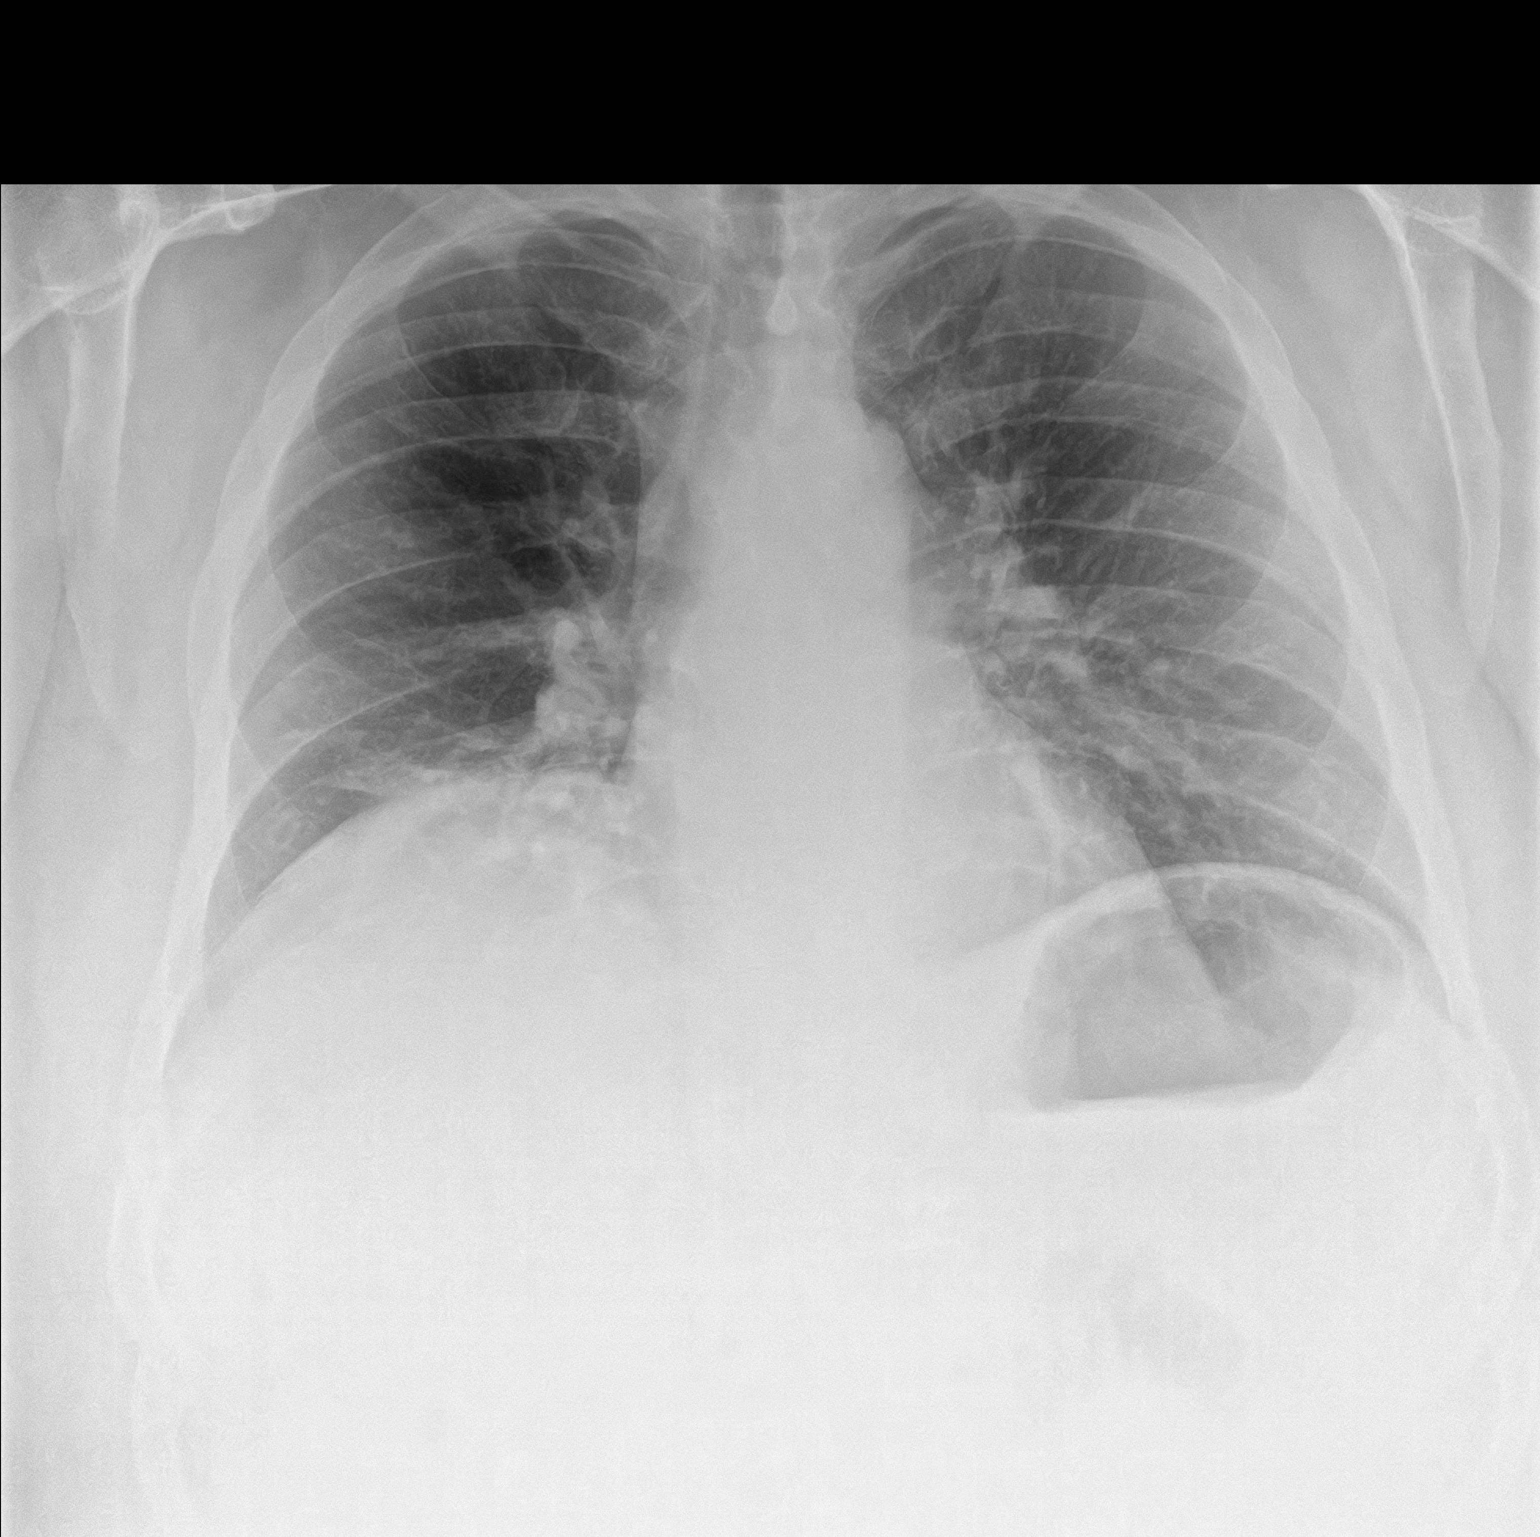
[im 2/2]
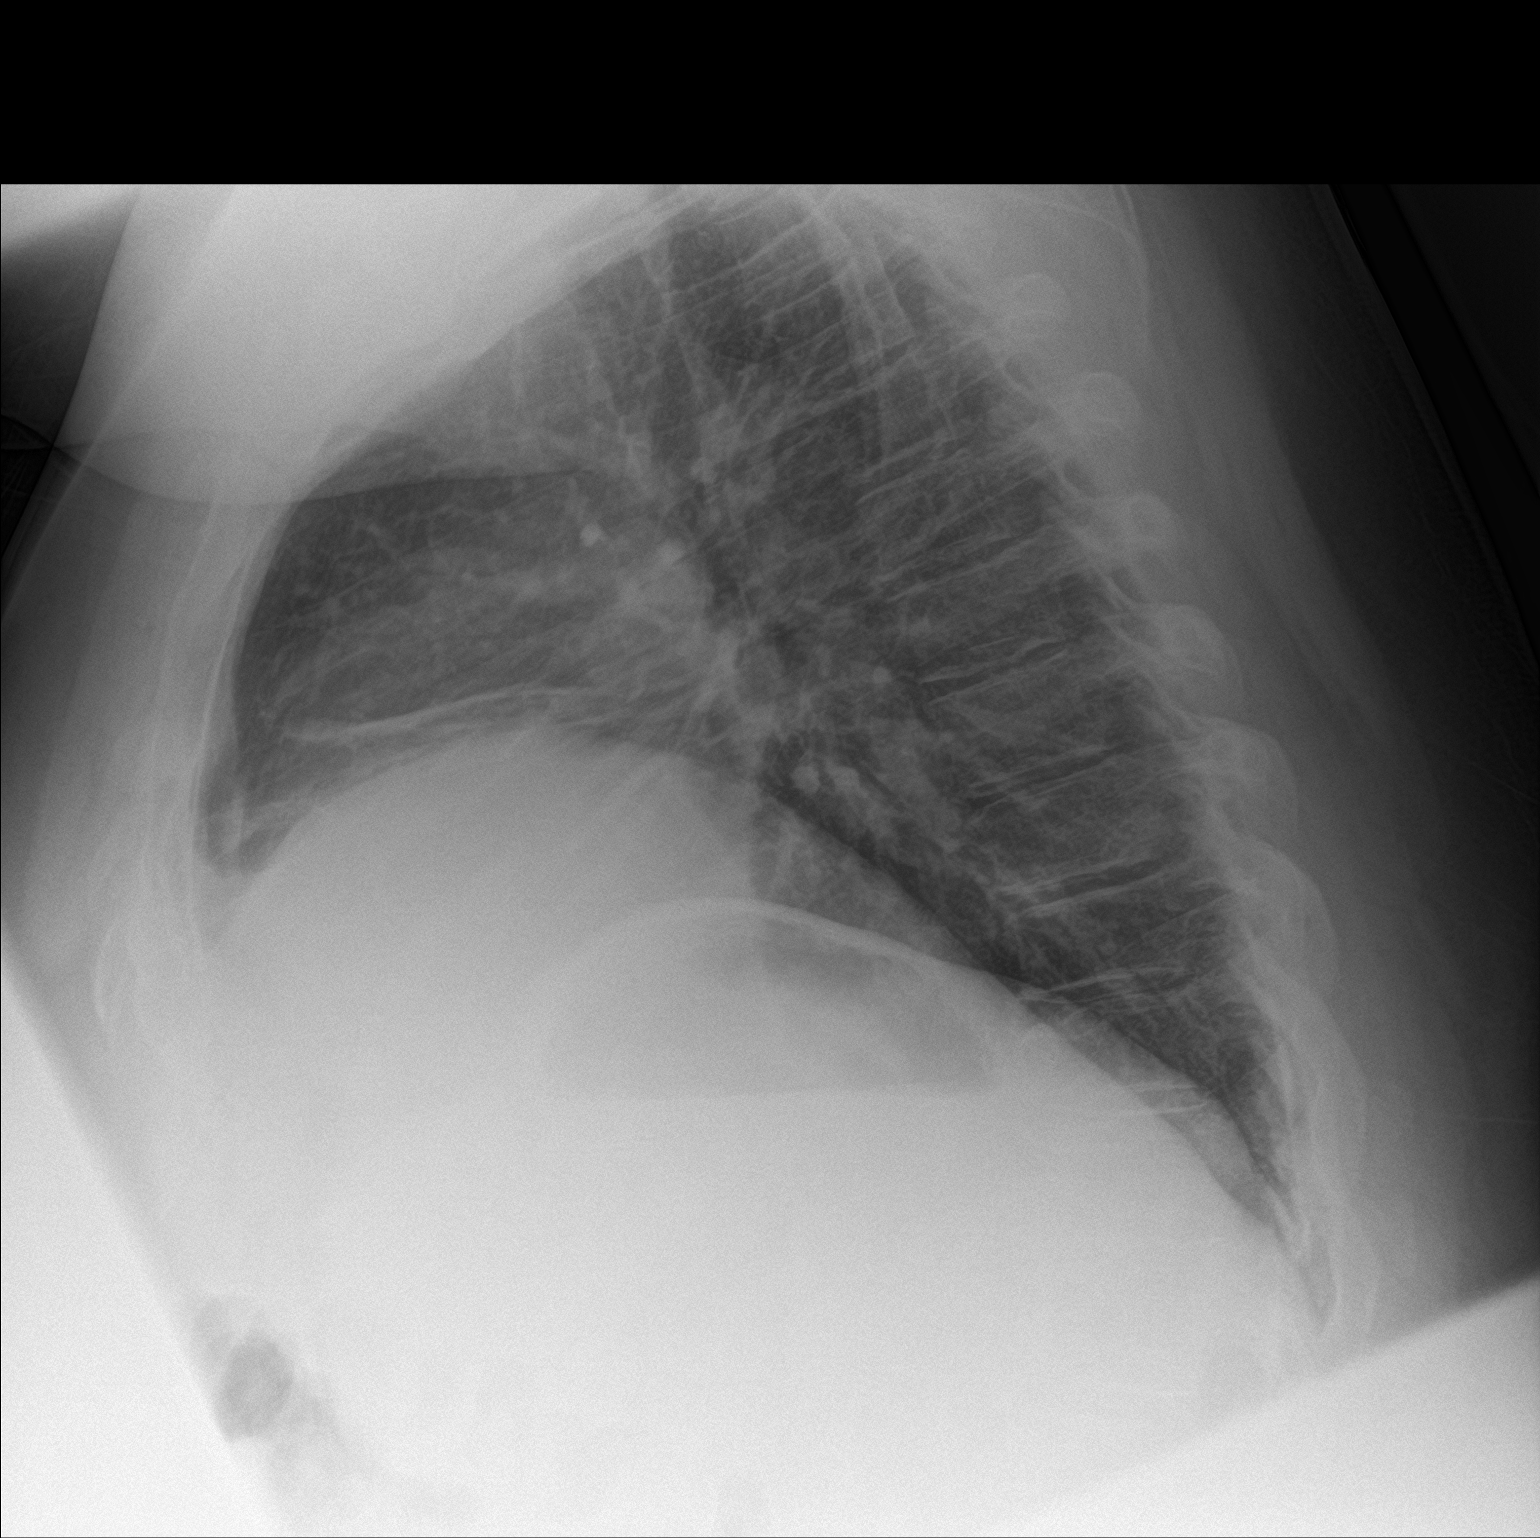

[2 of 2 positions shown; findings below may reference images not displayed]

FINDINGS: Mild eventration of the right hemidiaphragm similar to prior
radiograph. Minimal bibasilar densities, likely atelectatic changes.
No focal consolidation, pleural effusion, or pneumothorax. The
cardiac silhouette is within normal limits. No acute osseous
pathology.
IMPRESSION: Right lung base atelectasis.  Infiltrate is less likely.

## 2020-12-30 IMAGING — US US RENAL
1 series · 14 of 25 positions shown · non-contrast
Comparison: None.

CLINICAL DATA: Acute renal injury

EXAM:
RENAL / URINARY TRACT ULTRASOUND COMPLETE

[Series 1: us renal · 0.34mm/px · 14 of 37 slices shown]
[im 1/37]
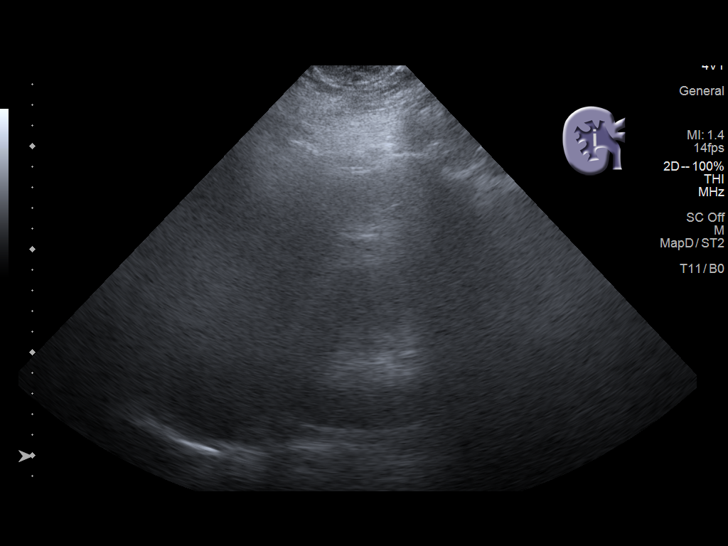
[im 4/37]
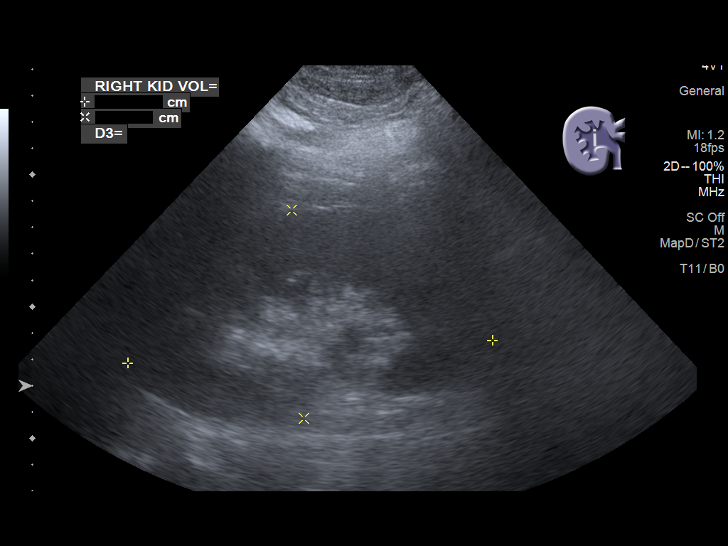
[im 7/37]
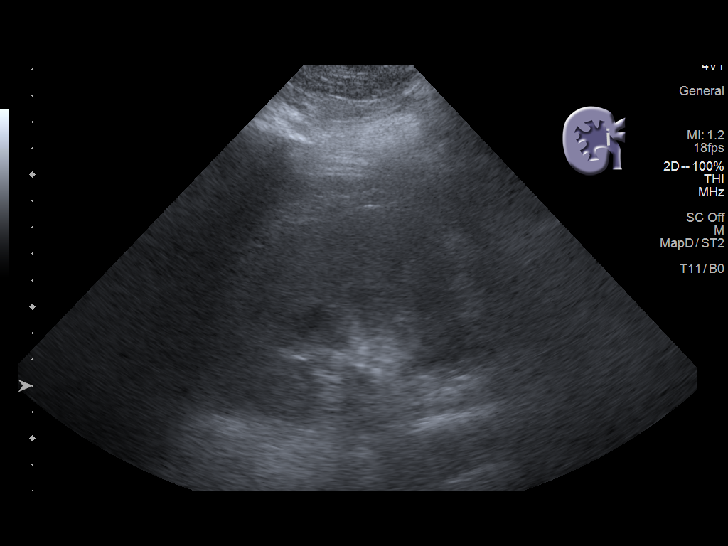
[im 10/37]
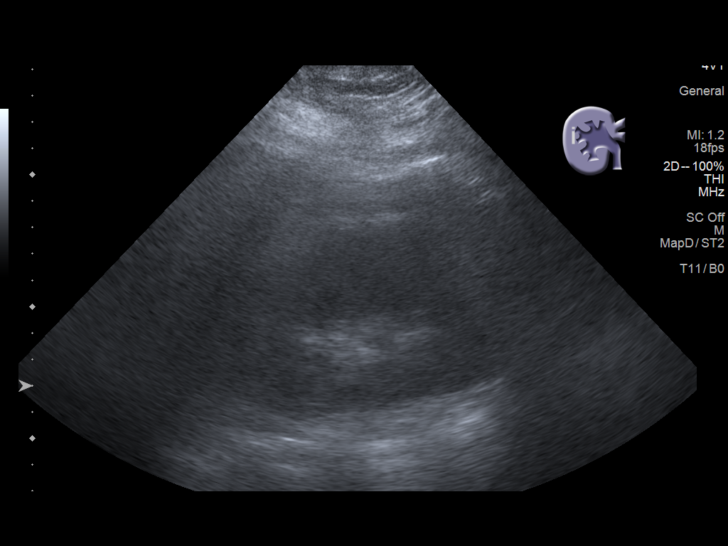
[im 13/37]
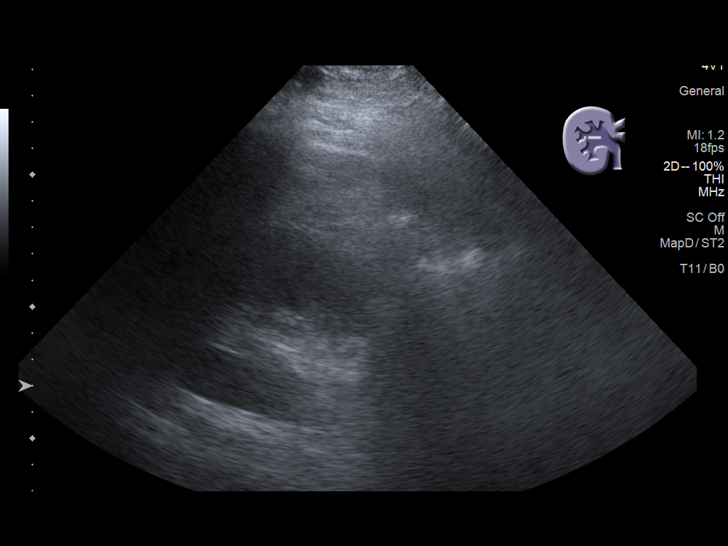
[im 14/37]
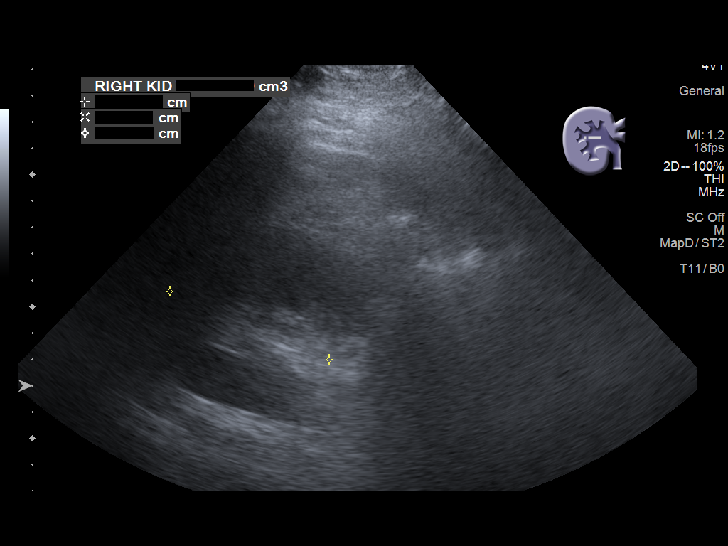
[im 17/37]
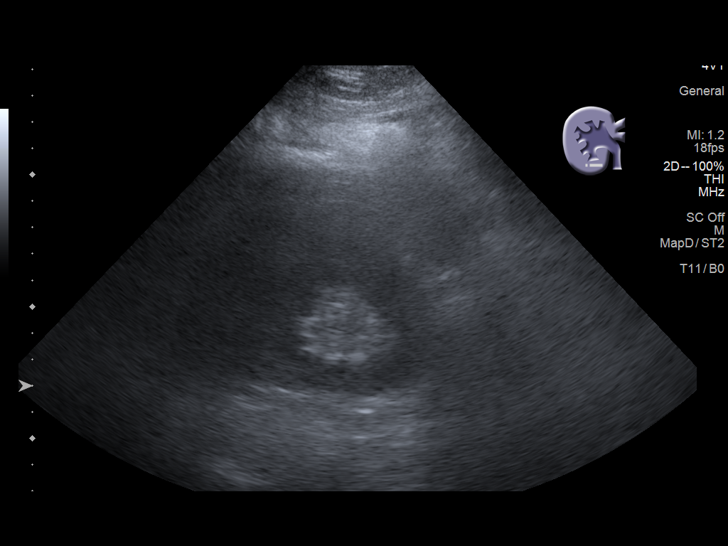
[im 20/37]
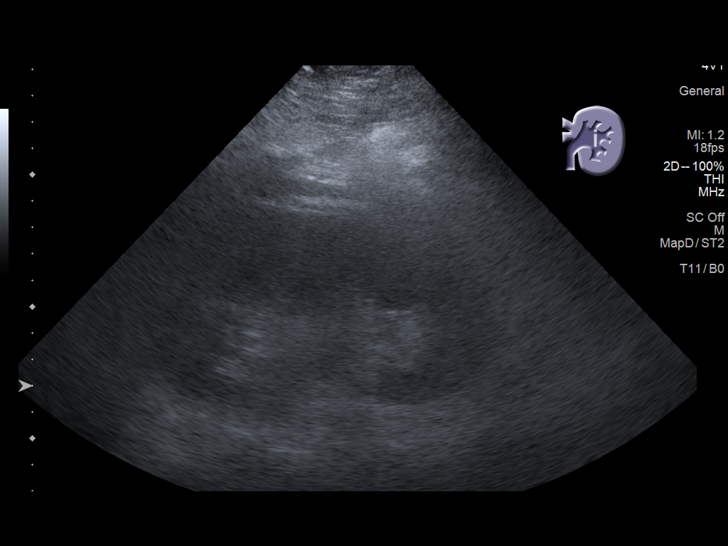
[im 23/37]
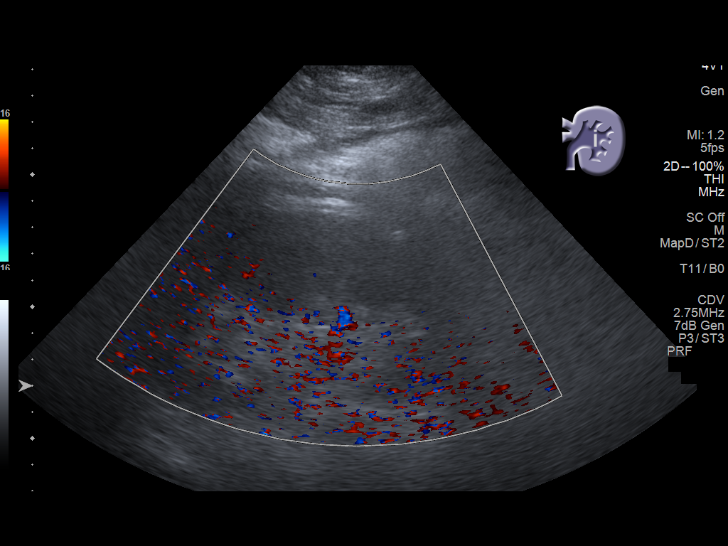
[im 25/37]
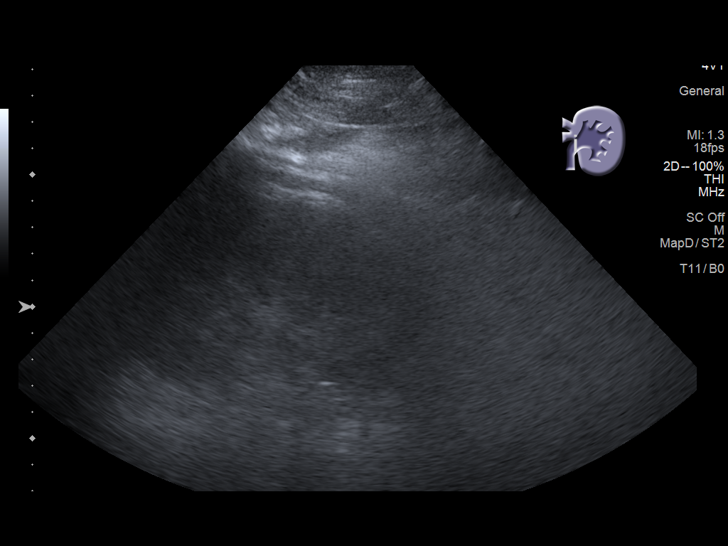
[im 28/37]
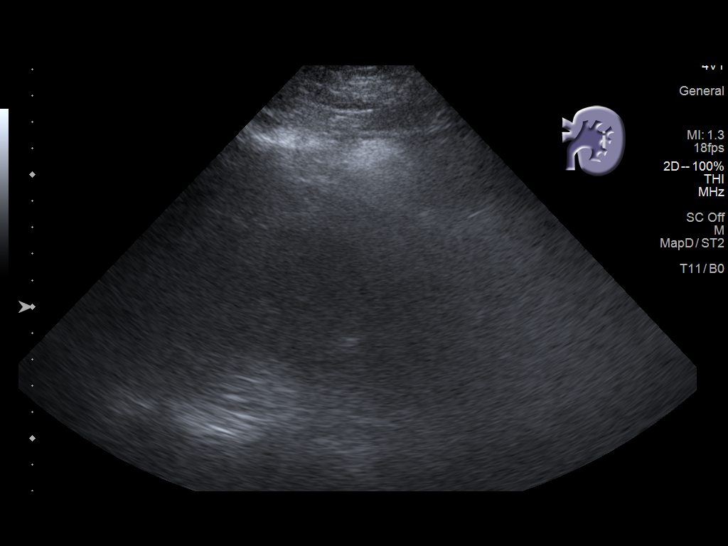
[im 31/37]
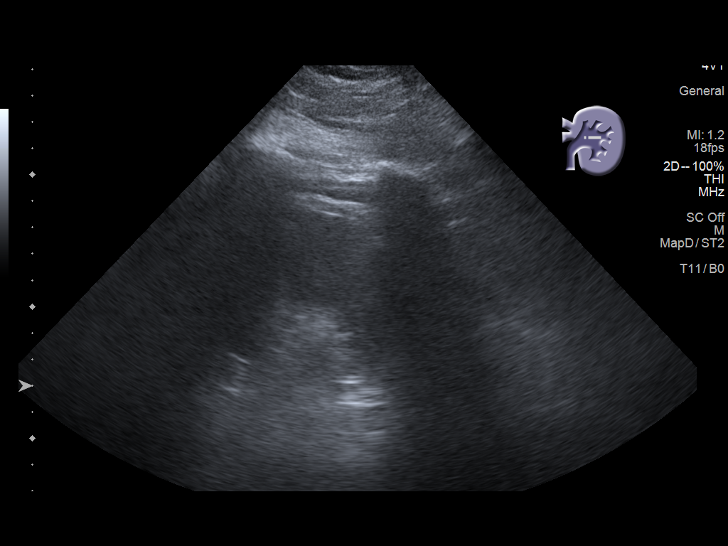
[im 34/37]
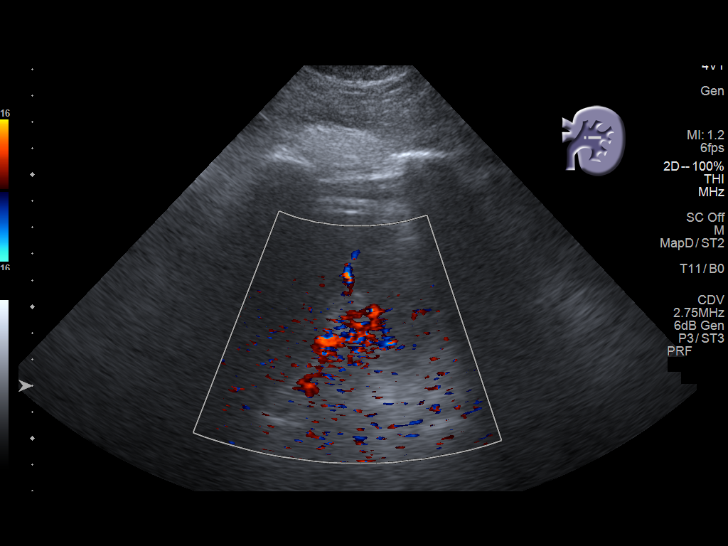
[im 37/37]
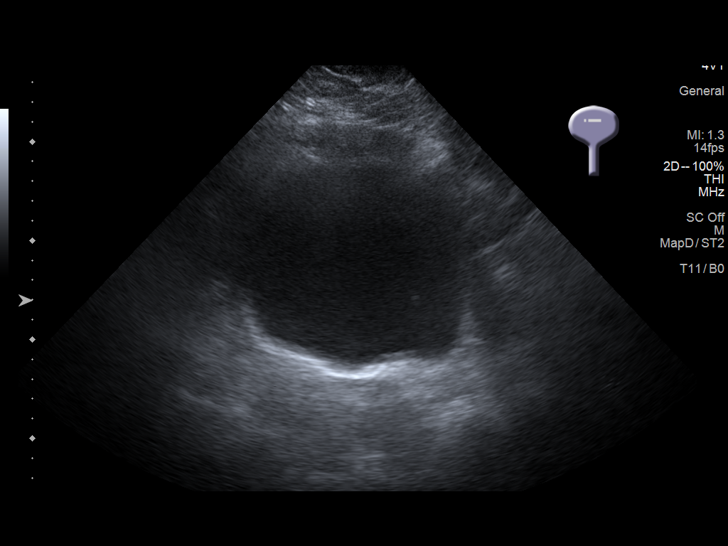

[14 of 25 positions shown; findings below may reference images not displayed]

FINDINGS: Right Kidney:

Renal measurements: 13.9 x 7.9 x 6.6 cm. = volume: 377 mL .
Echogenicity within normal limits. No mass or hydronephrosis
visualized.

Left Kidney:

Renal measurements: 13.1 x 7.5 x 6.3 cm = volume: 320: ML.
Echogenicity within normal limits. No mass or hydronephrosis
visualized.

Bladder:

Appears normal for degree of bladder distention.

Other:

None.
IMPRESSION: Unremarkable renal ultrasound.

## 2021-07-23 ENCOUNTER — Encounter: Payer: Self-pay | Admitting: Urology

## 2021-10-06 ENCOUNTER — Ambulatory Visit: Payer: BC Managed Care – PPO | Admitting: Urology

## 2021-10-07 ENCOUNTER — Ambulatory Visit: Payer: Self-pay | Admitting: Urology

## 2022-09-21 ENCOUNTER — Ambulatory Visit: Payer: BC Managed Care – PPO | Admitting: Urology

## 2022-09-21 VITALS — BP 118/76 | HR 98 | Ht 72.0 in | Wt >= 6400 oz

## 2022-09-21 DIAGNOSIS — R8271 Bacteriuria: Secondary | ICD-10-CM

## 2022-09-21 DIAGNOSIS — Z87440 Personal history of urinary (tract) infections: Secondary | ICD-10-CM | POA: Diagnosis not present

## 2022-09-21 LAB — BLADDER SCAN AMB NON-IMAGING

## 2022-09-21 NOTE — Patient Instructions (Signed)
Asymptomatic Bacteriuria Asymptomatic bacteriuria is the presence of a large number of bacteria in the urine without the usual symptoms of burning or frequent urination. This is usually not harmful, and treatment may not be needed. A person with this condition will not be more likely to develop an infection in the future. What are the causes? This condition is caused by an increase in bacteria in the urine. This increase can be caused by: Bacteria entering the urinary tract, such as during sex. A blockage in the urinary tract, such as from kidney stones or a tumor. Bladder problems that prevent the bladder from emptying. What increases the risk? You are more likely to develop this condition if: You have diabetes. You are an older adult. This especially affects older adults in long-term care facilities. You are pregnant and in the first trimester. You have kidney stones. You are male. You have had a kidney transplant. You have a leaky kidney tube valve (reflux). You had a urinary catheter for a long period of time. This is a long, thin tube that collects urine. What are the signs or symptoms? There are no symptoms of this condition. How is this diagnosed? This condition is diagnosed with a urine test. Because this condition does not cause symptoms, it is usually diagnosed when a urine sample is taken to treat or diagnose another condition, such as pregnancy or kidney problems. Most women who are in their first trimester of pregnancy are screened for asymptomatic bacteriuria. How is this treated? Usually, treatment is not needed for this condition. Treating the condition can lead to other problems, such as a yeast infection or the growth of bacteria that do not respond to treatment (antibiotic-resistant bacteria). Some people do need treatment with antibiotic medicines to prevent kidney infection, known as pyelonephritis. Treatment is needed if: You are pregnant. In pregnant women, kidney  infection can lead to: Early labor (premature labor). Very low birth weight (fetal growth restriction). Newborn death. You are having a procedure that affects the urinary tract. You have had a kidney transplant. If you are diagnosed with this condition, talk with your health care provider about any concerns that you have. Follow these instructions at home: Medicines Take over-the-counter and prescription medicines only as told by your health care provider. If you were prescribed an antibiotic medicine, take it as told by your health care provider. Do not stop using the antibiotic even if you start to feel better. General instructions Monitor your condition for any changes. Drink enough fluid to keep your urine pale yellow. Urinate more often to keep your bladder empty. If you are male, keep the area around your vagina and rectum clean. Wipe from front to back after urinating or having a bowel movement. Use each piece of toilet paper only once. Keep all follow-up visits. This is important. Contact a health care provider if: You have symptoms of a urine infection, such as: A burning sensation, or pain when you urinate. A strong need to urinate, or urinating more often. Urine turning discolored or cloudy. Blood in your urine. Urine that smells bad. Get help right away if: You develop signs of a kidney infection, such as: Back pain or pelvic pain. A fever or chills. Nausea or vomiting. Severe pain that cannot be controlled with medicine. Summary Asymptomatic bacteriuria is the presence of a large number of bacteria in the urine without the usual symptoms of burning or frequent urination. Usually, treatment is not needed for this condition. Treating the condition can lead  to other problems, such as a yeast infection or the growth of bacteria that do not respond to treatment. Some people do need treatment. Treatment is needed if you are pregnant, if you are having a procedure that  affects the urinary tract, or if you have had a kidney transplant. If you were prescribed an antibiotic medicine, take it as told by your health care provider. Do not stop using the antibiotic even if you start to feel better. This information is not intended to replace advice given to you by your health care provider. Make sure you discuss any questions you have with your health care provider. Document Revised: 02/21/2020 Document Reviewed: 02/21/2020 Else[employer] Patient Education  [employer].

## 2022-09-21 NOTE — Progress Notes (Signed)
   09/21/2022 8:55 AM   Louis [name] [birth year] [id number]  Reason for visit: Follow up abnormal urinalysis and pyuria  HPI: 62 year old male with a number of comorbidities including morbid obesity(BMI 61), atrial fibrillation on anticoagulation, CKD, and BPH on Flomax who I originally saw in March 2022 for pyuria without any UTI type symptoms.  Renal ultrasound at that time was benign.  He was treated with Keflex, and I recommended 1 month follow-up for repeat urinalysis and consider CT urogram if recurrent pyuria/bacteriuria, but he never followed up.  He was re-referred for recent abnormal urinalysis with PCP on 08/23/2022 with 3 squamous cells, greater than 100 WBC, 16 RBC, 5-50 bacteria, 3+ leukocyte Estrace, nitrite negative.  This was not sent for culture.  PSA is normal at 0.03.  He again denies any urinary symptoms today, specifically any dysuria, gross hematuria, urgency/frequency.  Urinalysis is pending today.  I again offered CT urogram for further evaluation of his persistent pyuria and rule out other etiology including nephrolithiasis, obstruction, malignancy.  He would like to defer CT for now and understands the risks of missing additional pathology.  We also reviewed the concept of colonization and asymptomatic bacteriuria, which is the more likely scenario in his case with absence of urinary symptoms.  Follow-up urine culture and atypical cultures, call with results     Billey Co, MD  [facility] [address], [place], [place] [address] [phone]

## 2022-09-22 LAB — URINALYSIS, COMPLETE
Bilirubin, UA: NEGATIVE
Glucose, UA: NEGATIVE
Ketones, UA: NEGATIVE
Nitrite, UA: NEGATIVE
Protein,UA: NEGATIVE
Specific Gravity, UA: 1.015 (ref 1.005–1.030)
Urobilinogen, Ur: 1 mg/dL (ref 0.2–1.0)
pH, UA: 7 (ref 5.0–7.5)

## 2022-09-22 LAB — MICROSCOPIC EXAMINATION: WBC, UA: >30 /hpf — AB (ref 0–5)

## 2022-09-24 LAB — CULTURE, URINE COMPREHENSIVE

## 2022-09-28 LAB — MYCOPLASMA / UREAPLASMA CULTURE
Mycoplasma hominis Culture: NEGATIVE
Ureaplasma urealyticum: NEGATIVE
